# Patient Record
Sex: Male | Born: 1995
Health system: Southern US, Community
[De-identification: ages and names within clinical notes are randomized; demographics above are authoritative.]

## PROBLEM LIST (undated history)

## (undated) DIAGNOSIS — R0602 Shortness of breath: Secondary | ICD-10-CM

## (undated) DIAGNOSIS — R0600 Dyspnea, unspecified: Secondary | ICD-10-CM

## (undated) DIAGNOSIS — R002 Palpitations: Secondary | ICD-10-CM

## (undated) DIAGNOSIS — R5383 Other fatigue: Secondary | ICD-10-CM

## (undated) DIAGNOSIS — R079 Chest pain, unspecified: Secondary | ICD-10-CM

## (undated) DIAGNOSIS — R42 Dizziness and giddiness: Secondary | ICD-10-CM

## (undated) DIAGNOSIS — R000 Tachycardia, unspecified: Secondary | ICD-10-CM

## (undated) DIAGNOSIS — R0609 Other forms of dyspnea: Secondary | ICD-10-CM

## (undated) HISTORY — DX: Tachycardia, unspecified: R00.0

## (undated) HISTORY — DX: Palpitations: R00.2

## (undated) HISTORY — DX: Other forms of dyspnea: R06.09

## (undated) HISTORY — DX: Dyspnea, unspecified: R06.00

## (undated) HISTORY — PX: FRACTURE SURGERY: SHX138

## (undated) HISTORY — DX: Other fatigue: R53.83

## (undated) HISTORY — DX: Dizziness and giddiness: R42

## (undated) HISTORY — DX: Shortness of breath: R06.02

## (undated) HISTORY — DX: Chest pain, unspecified: R07.9

---
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
## 2018-10-23 ENCOUNTER — Other Ambulatory Visit: Payer: Self-pay

## 2018-10-23 ENCOUNTER — Emergency Department (HOSPITAL_COMMUNITY)
Admission: EM | Admit: 2018-10-23 | Discharge: 2018-10-23 | Disposition: A | Discharge status: Home / Self Care | Payer: Self-pay | Attending: Emergency Medicine | Admitting: Emergency Medicine

## 2018-10-23 ENCOUNTER — Encounter (HOSPITAL_COMMUNITY): Payer: Self-pay | Admitting: Emergency Medicine

## 2018-10-23 ENCOUNTER — Emergency Department (HOSPITAL_COMMUNITY): Payer: Self-pay

## 2018-10-23 DIAGNOSIS — R0789 Other chest pain: Secondary | ICD-10-CM | POA: Insufficient documentation

## 2018-10-23 DIAGNOSIS — R0602 Shortness of breath: Secondary | ICD-10-CM

## 2018-10-23 LAB — BASIC METABOLIC PANEL
Anion gap: 9 (ref 5–15)
BUN: 16 mg/dL (ref 6–20)
CALCIUM: 9.6 mg/dL (ref 8.9–10.3)
CO2: 21 mmol/L — ABNORMAL LOW (ref 22–32)
CREATININE: 0.92 mg/dL (ref 0.61–1.24)
Chloride: 106 mmol/L (ref 98–111)
Glucose, Bld: 103 mg/dL — ABNORMAL HIGH (ref 70–99)
Potassium: 3.8 mmol/L (ref 3.5–5.1)
SODIUM: 136 mmol/L (ref 135–145)

## 2018-10-23 LAB — CBC WITH DIFFERENTIAL/PLATELET
Abs Immature Granulocytes: 0.03 10*3/uL (ref 0.00–0.07)
BASOS ABS: 0 10*3/uL (ref 0.0–0.1)
BASOS PCT: 1 %
EOS PCT: 2 %
Eosinophils Absolute: 0.2 10*3/uL (ref 0.0–0.5)
HCT: 49.7 % (ref 39.0–52.0)
HEMOGLOBIN: 15.6 g/dL (ref 13.0–17.0)
Immature Granulocytes: 0 %
LYMPHS PCT: 19 %
Lymphs Abs: 1.5 10*3/uL (ref 0.7–4.0)
MCH: 28.2 pg (ref 26.0–34.0)
MCHC: 31.4 g/dL (ref 30.0–36.0)
MCV: 89.7 fL (ref 80.0–100.0)
Monocytes Absolute: 0.6 10*3/uL (ref 0.1–1.0)
Monocytes Relative: 7 %
NEUTROS ABS: 5.8 10*3/uL (ref 1.7–7.7)
NRBC: 0 % (ref 0.0–0.2)
Neutrophils Relative %: 71 %
PLATELETS: 279 10*3/uL (ref 150–400)
RBC: 5.54 MIL/uL (ref 4.22–5.81)
RDW: 12.4 % (ref 11.5–15.5)
WBC: 8.1 10*3/uL (ref 4.0–10.5)

## 2018-10-23 LAB — TROPONIN I

## 2018-10-23 MED ORDER — ALBUTEROL SULFATE (2.5 MG/3ML) 0.083% IN NEBU
5.0000 mg | INHALATION_SOLUTION | Freq: Once | RESPIRATORY_TRACT | Status: DC
  Filled 2018-10-23: qty 6

## 2018-10-23 MED ORDER — KETOROLAC TROMETHAMINE 30 MG/ML IJ SOLN
30.0000 mg | Freq: Once | INTRAMUSCULAR | Status: AC
  Administered 2018-10-23: 30 mg via INTRAVENOUS
  Filled 2018-10-23: qty 1

## 2018-10-23 MED ORDER — SODIUM CHLORIDE 0.9 % IV BOLUS
1000.0000 mL | Freq: Once | INTRAVENOUS | Status: AC
  Administered 2018-10-23: 1000 mL via INTRAVENOUS

## 2018-10-23 NOTE — ED Notes (Signed)
Pt ambulated with no assistance around nurses station. O2 saturation maintain 91%-96%. Pt denies SOB

## 2018-10-23 NOTE — ED Provider Notes (Signed)
[facility] EMERGENCY DEPARTMENT Provider Note   CSN: [id number] Arrival date & time: 10/23/18  1516     History   Chief Complaint Chief Complaint  Patient presents with  . Shortness of Breath    HPI Bob Khan is a 23 y.o. male.  He is brought in by family for acute onset of tachycardia and shortness of breath with a little bit of chest discomfort that started while he was working out at [employer].  It sounds like he drank fairly heavily last night.  Today he ate some food and then did his pre-routine supplements along with having taken a Claritin-D and ibuprofen.  [place] Spaniel he was working out fairly intensely when he noticed that his heart was beating very fast and he made him feel short of breath.  Currently he feels his heart rate and shortness of breath have improved although he still intermittently having some twinges in his chest.  The history is provided by the patient.  Shortness of Breath  This is a new problem. The current episode started 3 to 5 hours ago. The problem has been gradually improving. Associated symptoms include chest pain. Pertinent negatives include no fever, no rhinorrhea, no sore throat, no neck pain, no cough, no sputum production, no hemoptysis, no wheezing, no syncope, no abdominal pain, no rash, no leg pain and no leg swelling. The problem's precipitants include exercise. He has tried nothing for the symptoms. The treatment provided moderate relief. He has had no prior hospitalizations. He has had no prior ED visits. He has had no prior ICU admissions.    History reviewed. No pertinent past medical history.  There are no active problems to display for this patient.   Past Surgical History:  Procedure Laterality Date  . FRACTURE SURGERY          Home Medications    Prior to Admission medications   Not on File    Family History Family History  Problem Relation Age of Onset  . Heart disease Other     Social History Social History    Tobacco Use  . Smoking status: Never Smoker  . Smokeless tobacco: Never Used  Substance Use Topics  . Alcohol use: Yes  . Drug use: Never     Allergies   Patient has no known allergies.   Review of Systems Review of Systems  Constitutional: Negative for fever.  HENT: Negative for rhinorrhea and sore throat.   Eyes: Negative for visual disturbance.  Respiratory: Positive for shortness of breath. Negative for cough, hemoptysis, sputum production and wheezing.   Cardiovascular: Positive for chest pain. Negative for leg swelling and syncope.  Gastrointestinal: Negative for abdominal pain.  Genitourinary: Negative for dysuria.  Musculoskeletal: Negative for neck pain.  Skin: Negative for rash.     Physical Exam Updated Vital Signs BP (!) 134/98 (BP Location: Right Arm)   Pulse (!) 107   Temp 98 F (36.7 C) (Oral)   Ht 6\' 5"  (1.956 m)   Wt 105.2 kg   SpO2 100%   BMI 27.51 kg/m   Physical Exam Vitals signs and nursing note reviewed.  Constitutional:      Appearance: He is well-developed.  HENT:     Head: Normocephalic and atraumatic.  Eyes:     Conjunctiva/sclera: Conjunctivae normal.  Neck:     Musculoskeletal: Neck supple.  Cardiovascular:     Rate and Rhythm: Normal rate and regular rhythm.     Heart sounds: No murmur.  Pulmonary:  Effort: Pulmonary effort is normal. No respiratory distress.     Breath sounds: Normal breath sounds.  Chest:     Chest wall: Tenderness present.  Abdominal:     Palpations: Abdomen is soft.     Tenderness: There is no abdominal tenderness.  Musculoskeletal: Normal range of motion.     Right lower leg: He exhibits no tenderness. No edema.     Left lower leg: He exhibits no tenderness. No edema.  Skin:    General: Skin is warm and dry.     Capillary Refill: Capillary refill takes less than 2 seconds.  Neurological:     General: No focal deficit present.     Mental Status: He is alert and oriented to person, place, and  time.     Motor: No weakness.      ED Treatments / Results  Labs (all labs ordered are listed, but only abnormal results are displayed) Labs Reviewed  BASIC METABOLIC PANEL - Abnormal; Notable for the following components:      Result Value   CO2 21 (*)    Glucose, Bld 103 (*)    All other components within normal limits  CBC WITH DIFFERENTIAL/PLATELET  TROPONIN I    EKG None  Radiology Dg Chest 2 View  Result Date: 10/23/2018 CLINICAL DATA:  Shortness of breath EXAM: CHEST - 2 VIEW COMPARISON:  None. FINDINGS: The heart size and mediastinal contours are within normal limits. Both lungs are clear. The visualized skeletal structures are unremarkable. IMPRESSION: No active cardiopulmonary disease. Electronically Signed   By: Jasmine Pang M.D.   On: 10/23/2018 15:45    Procedures Procedures (including critical care time)  Medications Ordered in ED Medications  albuterol (PROVENTIL) (2.5 MG/3ML) 0.083% nebulizer solution 5 mg (5 mg Nebulization Not Given 10/23/18 1645)  sodium chloride 0.9 % bolus 1,000 mL (1,000 mLs Intravenous New Bag/Given 10/23/18 1718)  ketorolac (TORADOL) 30 MG/ML injection 30 mg (has no administration in time range)     Initial Impression / Assessment and Plan / ED Course  I have reviewed the triage vital signs and the nursing notes.  Pertinent labs & imaging results that were available during my care of the patient were reviewed by me and considered in my medical decision making (see chart for details).  Clinical Course as of Oct 24 902  Wed Oct 23, 2018  1713 EKG is sinus arrhythmia rate of 95 normal intervals no acute ST-T changes no prior to compare with.   [MB]  1929 Patient received some Toradol and IV fluids with improvement in his symptoms.  His lab work including troponin chest x-ray were unremarkable.  He had a trending pulse ox which she did well.  Patient and family are comfortable with discharge and understand to follow-up with her doctor  and return if any worsening symptoms.   [MB]    Clinical Course User Index [MB] Terrilee Files, MD     Final Clinical Impressions(s) / ED Diagnoses   Final diagnoses:  SOB (shortness of breath)  Acute chest wall pain    ED Discharge Orders    None       Terrilee Files, MD 10/24/18 [phone]

## 2018-10-23 NOTE — ED Triage Notes (Signed)
Patient reports elevated heart rate and SOB that started about 30 minutes ago. Patient states he was at the gym when symptoms started.

## 2018-10-23 NOTE — Progress Notes (Signed)
Dr. Charm Barges discontinued nebulizer treatment. Medication was already opened and placed in nebulizer

## 2018-10-23 NOTE — Discharge Instructions (Addendum)
You were seen in the emergency department for some shortness of breath and pain in your chest.  Your chest x-ray blood work and EKG did not show an obvious cause of your pain.  Your symptoms improved in the department.  Please continue Tylenol and ibuprofen as needed and keep well-hydrated.  Follow-up with your doctor and return if any worsening symptoms.

## 2018-10-23 NOTE — ED Triage Notes (Signed)
Patient reports drinking a large amount of alcohol last night.

## 2018-10-30 ENCOUNTER — Encounter: Payer: Self-pay | Admitting: Family Medicine

## 2018-10-30 ENCOUNTER — Ambulatory Visit: Admitting: Family Medicine

## 2018-10-30 VITALS — BP 130/78 | HR 70 | Ht 77.0 in | Wt 233.0 lb

## 2018-10-30 DIAGNOSIS — R5383 Other fatigue: Secondary | ICD-10-CM

## 2018-10-30 DIAGNOSIS — R000 Tachycardia, unspecified: Secondary | ICD-10-CM | POA: Diagnosis not present

## 2018-10-30 DIAGNOSIS — R0609 Other forms of dyspnea: Secondary | ICD-10-CM | POA: Diagnosis not present

## 2018-10-30 NOTE — Progress Notes (Addendum)
Subjective:    Patient ID: Bob Khan, male    DOB: 15-Apr-1996, 23 y.o.   MRN: [id number]  HPI Patient is here today for a hospital follow up after going to the Ed on New Years day. He states he went to the gym and mid work out he started having mid center chest pain,short of breath,and dizziness.He states the hospital told him the test they ran were all great and could be a pulled muscle. He states he still has some sharp chest pain which radiates to his back.He still has some shortness of breath. Very nice patient Has a complex history As above stated that it started on New Year's Day He ended up having to go to ER His heart rate was running very fast This patient is very athletic he runs 3 to 6 miles almost every single day he also lifts weights He does states he takes a pre-workout powder that does have caffeine in it The night before he did have some alcohol He states he is never had this problem before The heart rate went very fast lasted for at least an hour then it calm down left him short of breath and felt like he was dizzy and then passed out since then he has had a few different spells with a heart rate had sped up very fast lasted for several minutes then went away but he was not on any caffeine or alcohol at that time he denies being stressed out denies panic attacks but he does relate that he got somewhat anxious about these episodes  Review of Systems  Constitutional: Negative for diaphoresis and fatigue.  HENT: Negative for congestion and rhinorrhea.   Respiratory: Negative for cough and shortness of breath.   Cardiovascular: Positive for palpitations. Negative for chest pain and leg swelling.  Gastrointestinal: Negative for abdominal pain and diarrhea.  Skin: Negative for color change and rash.  Neurological: Negative for dizziness and headaches.  Psychiatric/Behavioral: Negative for behavioral problems and confusion.       Objective:   Physical Exam Vitals  signs reviewed.  Constitutional:      General: He is not in acute distress. HENT:     Head: Normocephalic and atraumatic.  Eyes:     General:        Right eye: No discharge.        Left eye: No discharge.  Neck:     Trachea: No tracheal deviation.  Cardiovascular:     Rate and Rhythm: Normal rate and regular rhythm.     Heart sounds: Normal heart sounds. No murmur.  Pulmonary:     Effort: Pulmonary effort is normal. No respiratory distress.     Breath sounds: Normal breath sounds.  Lymphadenopathy:     Cervical: No cervical adenopathy.  Skin:    General: Skin is warm and dry.  Neurological:     Mental Status: He is alert.     Coordination: Coordination normal.  Psychiatric:        Behavior: Behavior normal.    I reviewed over all the labs x-rays EKG ER note       Assessment & Plan:  It is quite possible that the caffeine workout powder as well as alcohol the night before contributed to his problem But he has had this issue several different times since then without having no supplements It is felt more than likely that there is some underlying arrhythmia tendency possibly PSVT  I believe it is reasonable for him  to see cardiology more than likely they will do echo to make sure he is not developing any type of a hypertrophic cardiomyopathy plus also to make sure with the telemetry that he is not developing an arrhythmia issue  This patient is moving to [facility] in several weeks but states he is willing to see local cardiology to get treated and evaluated.  He will avoid all caffeine and alcohol no medications currently lab work ordered await the results 25 minutes spent with patient  Addendum- thyroid testing and d-dimer were ordered to exclude the possibility of hyperthyroidism and pulmonary embolism.  It was felt that the likelihood of pulmonary embolism was low but possible.  D-dimer came back positive on the ninth.  Patient was spoken with.  He was having  some ongoing chest pains and shortness of breath.  He was referred to [facility] -ER doctor in triage was spoken with regarding the elevated d-dimer and concern for the possibility of pulmonary embolus as well as the need for CT angio They stated that they would be aware and worked him up when he presents

## 2018-10-31 ENCOUNTER — Telehealth: Payer: Self-pay | Admitting: Family Medicine

## 2018-10-31 ENCOUNTER — Encounter (HOSPITAL_COMMUNITY): Payer: Self-pay | Admitting: Emergency Medicine

## 2018-10-31 ENCOUNTER — Emergency Department (HOSPITAL_COMMUNITY): Payer: Non-veteran care

## 2018-10-31 ENCOUNTER — Other Ambulatory Visit: Payer: Self-pay

## 2018-10-31 ENCOUNTER — Emergency Department (HOSPITAL_COMMUNITY)
Admission: EM | Admit: 2018-10-31 | Discharge: 2018-10-31 | Disposition: A | Discharge status: Home / Self Care | Payer: Non-veteran care | Attending: Emergency Medicine | Admitting: Emergency Medicine

## 2018-10-31 DIAGNOSIS — R0602 Shortness of breath: Secondary | ICD-10-CM

## 2018-10-31 DIAGNOSIS — R0600 Dyspnea, unspecified: Secondary | ICD-10-CM

## 2018-10-31 DIAGNOSIS — R079 Chest pain, unspecified: Secondary | ICD-10-CM

## 2018-10-31 DIAGNOSIS — R791 Abnormal coagulation profile: Secondary | ICD-10-CM | POA: Insufficient documentation

## 2018-10-31 DIAGNOSIS — R7989 Other specified abnormal findings of blood chemistry: Secondary | ICD-10-CM

## 2018-10-31 LAB — I-STAT CHEM 8, ED
BUN: 17 mg/dL (ref 6–20)
CALCIUM ION: 1.15 mmol/L (ref 1.15–1.40)
CHLORIDE: 105 mmol/L (ref 98–111)
CREATININE: 1 mg/dL (ref 0.61–1.24)
GLUCOSE: 92 mg/dL (ref 70–99)
HCT: 47 % (ref 39.0–52.0)
Hemoglobin: 16 g/dL (ref 13.0–17.0)
Potassium: 4.1 mmol/L (ref 3.5–5.1)
SODIUM: 138 mmol/L (ref 135–145)
TCO2: 25 mmol/L (ref 22–32)

## 2018-10-31 LAB — TSH: TSH: 1.88 u[IU]/mL (ref 0.450–4.500)

## 2018-10-31 LAB — I-STAT TROPONIN, ED: Troponin i, poc: 0 ng/mL (ref 0.00–0.08)

## 2018-10-31 LAB — D-DIMER, QUANTITATIVE (NOT AT ARMC): D-DIMER: 1.54 mg{FEU}/L — AB (ref 0.00–0.49)

## 2018-10-31 LAB — T4, FREE: FREE T4: 1.3 ng/dL (ref 0.82–1.77)

## 2018-10-31 MED ORDER — IOPAMIDOL (ISOVUE-370) INJECTION 76%
100.0000 mL | Freq: Once | INTRAVENOUS | Status: AC | PRN
  Administered 2018-10-31: 100 mL via INTRAVENOUS

## 2018-10-31 NOTE — Telephone Encounter (Signed)
Pt called to check on lab results. 

## 2018-10-31 NOTE — ED Triage Notes (Signed)
Pt sent dr Kennis Carina sent for a ct a

## 2018-10-31 NOTE — Progress Notes (Signed)
Cardiology Office Note   Date:  11/04/2018   ID:  Bob Khan, DOB 1996/10/23, MRN [id number]  PCP:  Babs Sciara, MD  Cardiologist:   Charlton Haws, MD   No chief complaint on file.     History of Present Illness: Bob Khan is a 23 y.o. male who presents for consultation regarding tachycardia and dyspnea. Referred by Dr Gerda Diss. Patient seen in ER 10/23/18 after getting dyspnea, and chest discomfort at gym Had heavy ETOH night before and was taking supplements , cafeinne, Claritin D and ibuprofen Felt somewhat sudden onset of palpitations / tachycardia while working out that led to dyspnea and twinges of pain in his chest  Rx with Toradol and iv fluids with improvement  CXR NAD ECG SR rate 95 sinus arrhythmia with no pre excitation or SVT/PSVT. 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 Chest pain   . Dizziness   . Dyspnea on exertion   . Fatigue   . Palpitations   . SOB (shortness of breath)   . Tachycardia     Past Surgical History:  Procedure Laterality Date  . FRACTURE SURGERY       Current Outpatient Medications  Medication Sig Dispense Refill  . ibuprofen (ADVIL,MOTRIN) 200 MG tablet Take 800 mg by mouth every 6 (six) hours as needed for mild pain.     No current facility-administered medications for this visit.     Allergies:   Patient has no known allergies.    Social History:  The patient  reports that he has never smoked. He has never used smokeless tobacco. He reports current alcohol use. He reports that he does not use drugs.   Family History:  The patient's family history includes Heart disease in an other family member.    ROS:  Please see the  history of present illness.   Otherwise, review of systems are positive for none.   All other systems are reviewed and negative.    PHYSICAL EXAM: VS:  BP (!) 142/82   Pulse 68   Ht 6\' 4"  (1.93 m)   Wt 239 lb (108.4 kg)   BMI 29.09 kg/m  , BMI Body mass index is 29.09 kg/m. Affect appropriate Healthy:  appears stated age HEENT: normal Neck supple with no adenopathy JVP normal no bruits no thyromegaly Lungs clear with no wheezing and good diaphragmatic motion Heart:  S1/S2 no murmur, no rub, gallop or click PMI normal Abdomen: benighn, BS positve, no tenderness, no AAA no bruit.  No HSM or HJR Distal pulses intact with no bruits No edema Neuro non-focal Skin warm and dry No muscular weakness    EKG:  See HPI   Recent Labs: 10/23/2018: Platelets 279 10/30/2018: TSH 1.880 10/31/2018: BUN 17; Creatinine, Ser 1.00; Hemoglobin 16.0; Potassium 4.1; Sodium 138    Lipid Panel No results found for: CHOL, TRIG, HDL, CHOLHDL, VLDL, LDLCALC, LDLDIRECT    Wt Readings from Last 3 Encounters:  11/04/18 239 lb (108.4 kg)  10/31/18 233 lb (105.7 kg)  10/30/18 233 lb 0.6 oz (105.7 kg)      Other studies Reviewed: Additional studies/ records that were  reviewed today include: Notes from ER, labs, CXR ECG notes from primary .    ASSESSMENT AND PLAN:  1.  Tachycardia: likely related to hangover, dehydration, and stimulants. ECG no worrisome signs normal rhythm and no preexcitation see below 2. Dyspnea:  Normal exam , CXR and ECG see below regarding stress echo  3. Chest Pain:  D dimer positive in office CTA negative for PE f/u exercise stress echo    Current medicines are reviewed at length with the patient today.  The patient does not have concerns regarding medicines.  The following changes have been made:  no change  Labs/ tests ordered today include: Ex Stress echo   Orders Placed This Encounter  Procedures  . ECHOCARDIOGRAM STRESS TEST     Disposition:   FU with  cardiology PRN      Signed, Charlton Haws, MD  11/04/2018 11:33 AM    [facility] [address] [place], [place], [place]  [address] Phone: [phone]; Fax: [phone]

## 2018-10-31 NOTE — Discharge Instructions (Signed)
Please follow up with Cardiology Return if worsening (severe pain, worsening shortness of breath, passing out)

## 2018-10-31 NOTE — Telephone Encounter (Signed)
Per Dr Brett Canales: Patient has elevated d dimer and with his symptoms he will need CT angio of the chest to r/o the possibility of blood clot. If patient gets worse in the night with increased SOB or chest pain he needs to return to ER for evaluation and treatment.

## 2018-10-31 NOTE — Telephone Encounter (Signed)
I spoke with the patient over the phone he is having some chest pains and intermittent shortness of breath it is wise for him to get this checked out right away.  I spoke with the ER doctor and triage they will be seeing him and evaluating appropriately tonight

## 2018-10-31 NOTE — Telephone Encounter (Signed)
Patient advised of results of d- dimer. Patient stated he has had a worsening of his symptoms since last seen. He is having an increase in chest pain and doesn't feel like he can get a deep breath. He also feels like his arm is tight and tingling. Patient was advised to go to the ER for evaluation and treatment. Patient agreed to go to the ER.

## 2018-10-31 NOTE — ED Provider Notes (Signed)
[facility] EMERGENCY DEPARTMENT Provider Note   CSN: [id number] Arrival date & time: 10/31/18  1731     History   Chief Complaint Chief Complaint  Patient presents with  . Abnormal Lab    HPI Emeline Ginsicholas J Howk is a 23 y.o. male who presents with chest pain and shortness of breath.  No significant past medical history.  His symptoms started over a week ago and he was initially seen in the emergency department for this problem.  He states that he has had left-sided chest pain which radiates to his left arm and left upper back and shoulder area.  He has had intermittent lightheadedness and numbness and tingling of his left arm.  He also reports intermittent shortness of breath and pain with deep inspiration. It started over a week ago on Jan 1st while lifting weights at the gym. He rates the pain as a 7 out of 10. Things that make it worse is anything that makes his heart rate go up. He reports having a cold a couple weeks ago which has resolved. 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  He takes pre-workout supplements but otherwise denies smoking or any illicit drug use.  After he had a normal work-up in the ED he was sent back to his PCP.  His PCP performed thyroid test and obtained a d-dimer which came back elevated therefore he was sent back to the emergency department to rule out PE.  He has been referred to cardiology and is scheduled for an appointment on Monday.  He was taking ibuprofen for the pain which initially helped but now has not seemed to help at all. No recent surgery/travel/immobilization, hx of cancer, leg swelling, hemoptysis, prior DVT/PE, or hormone use.  HPI  History reviewed. No pertinent past medical history.  There are no active problems to display for this patient.   Past Surgical History:  Procedure Laterality Date  . FRACTURE SURGERY          Home Medications    Prior to Admission medications   Medication Sig Start Date End Date Taking? Authorizing  Provider  ibuprofen (ADVIL,MOTRIN) 200 MG tablet Take 800 mg by mouth every 6 (six) hours as needed for mild pain.    [provider]  loratadine-pseudoephedrine (CLARITIN-D 24 HOUR) 10-240 MG 24 hr tablet Take 1 tablet by mouth daily as needed for allergies.    [provider]    Family History Family History  Problem Relation Age of Onset  . Heart disease Other     Social History Social History   Tobacco Use  . Smoking status: Never Smoker  . Smokeless tobacco: Never Used  Substance Use Topics  . Alcohol use: Yes  . Drug use: Never     Allergies   Patient has no known allergies.   Review of Systems Review of Systems  Constitutional: Positive for diaphoresis. Negative for fever.  Respiratory: Positive for shortness of breath. Negative for cough and wheezing.   Cardiovascular: Positive for chest pain. Negative for palpitations and leg swelling.  Gastrointestinal: Negative for abdominal pain, nausea and vomiting.  Neurological: Positive for light-headedness. Negative for syncope.  All other systems reviewed and are negative.    Physical Exam Updated Vital Signs BP (!) 155/84 (BP Location: Right Arm)   Pulse 76   Temp 98 F (36.7 C) (Oral)   Resp 16   Ht 6\' 5"  (1.956 m)   Wt 105.7 kg   SpO2 100%   BMI 27.63  kg/m   Physical Exam Vitals signs and nursing note reviewed.  Constitutional:      General: He is not in acute distress.    Appearance: Normal appearance. He is well-developed.     Comments: Healthy appearing young male in NAD  HENT:     Head: Normocephalic and atraumatic.  Eyes:     General: No scleral icterus.       Right eye: No discharge.        Left eye: No discharge.     Conjunctiva/sclera: Conjunctivae normal.     Pupils: Pupils are equal, round, and reactive to light.  Neck:     Musculoskeletal: Normal range of motion.  Cardiovascular:     Rate and Rhythm: Normal rate and regular rhythm.     Pulses: Normal pulses.    Pulmonary:     Effort: Pulmonary effort is normal. No respiratory distress.     Breath sounds: Normal breath sounds.  Chest:     Chest wall: Tenderness (left side of chest wall) present.  Abdominal:     General: There is no distension.  Musculoskeletal:        General: Tenderness (left upper thoracic paraspinal muscle tenderness) present.  Skin:    General: Skin is warm and dry.  Neurological:     Mental Status: He is alert and oriented to person, place, and time.  Psychiatric:        Behavior: Behavior normal.      ED Treatments / Results  Labs (all labs ordered are listed, but only abnormal results are displayed) Labs Reviewed  I-STAT TROPONIN, ED  I-STAT CHEM 8, ED    EKG EKG Interpretation  Date/Time:  Thursday October 31 2018 18:18:23 EST Ventricular Rate:  74 PR Interval:    QRS Duration: 89 QT Interval:  371 QTC Calculation: 412 R Axis:   78 Text Interpretation:  Sinus rhythm Borderline ST elevation, anterolateral leads Unchanged from prior. No STEMI.  Confirmed by Alona BeneLong, Joshua [phone]([id number]) on 10/31/2018 6:23:51 PM   Radiology Ct Angio Chest Pe W/cm &/or Wo Cm  Result Date: 10/31/2018 CLINICAL DATA:  Chest pain and shortness of breath. EXAM: CT ANGIOGRAPHY CHEST WITH CONTRAST TECHNIQUE: Multidetector CT imaging of the chest was performed using the standard protocol during bolus administration of intravenous contrast. Multiplanar CT image reconstructions and MIPs were obtained to evaluate the vascular anatomy. CONTRAST:  100mL ISOVUE-370 IOPAMIDOL (ISOVUE-370) INJECTION 76% COMPARISON:  None. FINDINGS: Cardiovascular: The heart size is normal. No substantial pericardial effusion. No thoracic aortic aneurysm. No filling defects in the opacified pulmonary arteries to suggest acute pulmonary embolus. Mediastinum/Nodes: No mediastinal lymphadenopathy. There is no hilar lymphadenopathy. The esophagus has normal imaging features. There is no axillary lymphadenopathy.  Lungs/Pleura: Lungs are clear without focal airspace consolidation or pulmonary edema. No pleural effusion. No pneumothorax. Upper Abdomen: Unremarkable. Musculoskeletal: No worrisome lytic or sclerotic osseous abnormality. Review of the MIP images confirms the above findings. IMPRESSION: Unremarkable study.  No CT evidence for acute pulmonary embolus. Electronically Signed   By: Kennith CenterEric  Mansell M.D.   On: 10/31/2018 19:42    Procedures Procedures (including critical care time)  Medications Ordered in ED Medications  iopamidol (ISOVUE-370) 76 % injection 100 mL (100 mLs Intravenous Contrast Given 10/31/18 1919)     Initial Impression / Assessment and Plan / ED Course  I have reviewed the triage vital signs and the nursing notes.  Pertinent labs & imaging results that were available during my care of the patient were reviewed  by me and considered in my medical decision making (see chart for details).  23 year old male presents with chest pain, SOB and elevated d-dimer. He is hypertensive but otherwise vitals are normal. On exam, heart is regular rate and rhythm. Lungs are CTA. Chest pain is reproducible with palpation. EKG is SR and appears unchanged. I-stat trop and chem-8 are normal. CTA ordered.  CTA of chest is negative for PE. I suspect it is most likely MSK but also consider pericarditis since he recently had a cold. He has a f/u appt with Cardiology on Monday. Had long discussion with the patient, his mother, and girlfriend who are understandably concerned. They were given reassurance and encouraged to f/u. They were given return precautions.  Final Clinical Impressions(s) / ED Diagnoses   Final diagnoses:  Elevated d-dimer  Chest pain, unspecified type  Dyspnea, unspecified type    ED Discharge Orders    None       Bethel Born, PA-C 10/31/18 2049    Maia Plan, MD 10/31/18 2108

## 2018-11-01 ENCOUNTER — Telehealth: Payer: Self-pay | Admitting: Family Medicine

## 2018-11-01 ENCOUNTER — Ambulatory Visit (HOSPITAL_COMMUNITY): Payer: Non-veteran care

## 2018-11-01 NOTE — Telephone Encounter (Signed)
Please let the patient know that I am seeing people 1 after the next all the way through at least 530 I can call him when I finished seeing people This patient does have office visit with cardiologist on Monday he needs to go to that appointment I do not recommend that he does any workouts until he is seen by cardiology  I will be happy to call him later today if he is available

## 2018-11-01 NOTE — Telephone Encounter (Signed)
This patient will be seeing you on Monday He is a former [employer] He does a lot of intense exercise He was seen in the ER on January 1 with palpitations and tachycardia It is possible that this was related to caffeine supplement he took before workout But he has had several spells since then  ER did a work-up as well as our office CT scan negative for pulmonary embolism  Patient being referred to you/cardiology for further evaluation of tachycardia spells and ongoing intermittent chest pain May need echo/telemetry Thank you for your help-Tory Septer

## 2018-11-01 NOTE — Telephone Encounter (Signed)
Pt has some questions regarding his CT from yesterday. He also would like to know when he can return to working out.   CB# [phone].

## 2018-11-01 NOTE — Telephone Encounter (Signed)
The patient had some additional questions regarding his CAT scan He was also concerned about his sugar level Glucose in the ER was normal I reviewed over every aspect of his CAT scan which did not show pulmonary embolism  I also told the patient that this does not explain the fast heart rate he had in the ER and that it still necessary to see the cardiologist more than likely they will do telemetry as well as doing a echo of his heart if he has any ongoing questions he can notify us He voiced his understanding and had no further questions at this time I recommended for the patient to hold off on any exercise until he sees cardiology on Monday

## 2018-11-01 NOTE — Telephone Encounter (Signed)
Discussed with pt. That dr Lorin Picket is booked all the way til at least 5:30. Dr Lorin Picket states he should keep appt with cardiologist Monday and not to exercise until after he sees cardiologist and that dr scott will call him later this afternoon when he is finished seeing pts. Pt states he will be available and can call on same number in message. [phone].

## 2018-11-04 ENCOUNTER — Ambulatory Visit (INDEPENDENT_AMBULATORY_CARE_PROVIDER_SITE_OTHER): Payer: Non-veteran care | Admitting: Cardiovascular Disease

## 2018-11-04 ENCOUNTER — Encounter

## 2018-11-04 VITALS — BP 142/82 | HR 68 | Ht 76.0 in | Wt 239.0 lb

## 2018-11-04 DIAGNOSIS — R000 Tachycardia, unspecified: Secondary | ICD-10-CM

## 2018-11-04 DIAGNOSIS — R0609 Other forms of dyspnea: Secondary | ICD-10-CM | POA: Diagnosis not present

## 2018-11-04 NOTE — Patient Instructions (Signed)
Medication Instructions:   If you need a refill on your cardiac medications before your next appointment, please call your pharmacy.   Lab work:  If you have labs (blood work) drawn today and your tests are completely normal, you will receive your results only by: Marland Kitchen MyChart Message (if you have MyChart) OR . A paper copy in the mail If you have any lab test that is abnormal or we need to change your treatment, we will call you to review the results.  Testing/Procedures: Your physician has requested that you have a stress echocardiogram. For further information please visit [email or web address] Please follow instruction sheet as given.  Follow-Up: At [facility], you and your health needs are our priority.  As part of our continuing mission to provide you with exceptional heart care, we have created designated Provider Care Teams.  These Care Teams include your primary Cardiologist (physician) and Advanced Practice Providers (APPs -  Physician Assistants and Nurse Practitioners) who all work together to provide you with the care you need, when you need it. Your physician recommends that you schedule a follow-up appointment as needed with Dr. Eden Emms.

## 2018-11-07 ENCOUNTER — Telehealth (HOSPITAL_COMMUNITY): Payer: Self-pay | Admitting: *Deleted

## 2018-11-07 NOTE — Telephone Encounter (Signed)
Left message, per DPR, for upcoming stress echo on 11/11/18.  Bob Khan

## 2018-11-08 ENCOUNTER — Other Ambulatory Visit (HOSPITAL_COMMUNITY): Payer: Non-veteran care

## 2018-11-11 ENCOUNTER — Ambulatory Visit (HOSPITAL_COMMUNITY): Payer: Non-veteran care | Attending: Cardiology

## 2018-11-11 ENCOUNTER — Ambulatory Visit (HOSPITAL_BASED_OUTPATIENT_CLINIC_OR_DEPARTMENT_OTHER): Payer: No Typology Code available for payment source

## 2018-11-11 ENCOUNTER — Other Ambulatory Visit: Payer: Self-pay

## 2018-11-11 ENCOUNTER — Ambulatory Visit (INDEPENDENT_AMBULATORY_CARE_PROVIDER_SITE_OTHER): Admitting: Family Medicine

## 2018-11-11 ENCOUNTER — Encounter: Payer: Self-pay | Admitting: Family Medicine

## 2018-11-11 VITALS — BP 128/82 | Temp 97.7°F | Wt 237.0 lb

## 2018-11-11 DIAGNOSIS — R079 Chest pain, unspecified: Secondary | ICD-10-CM | POA: Diagnosis not present

## 2018-11-11 DIAGNOSIS — R000 Tachycardia, unspecified: Secondary | ICD-10-CM | POA: Diagnosis not present

## 2018-11-11 DIAGNOSIS — R0609 Other forms of dyspnea: Secondary | ICD-10-CM | POA: Diagnosis not present

## 2018-11-11 DIAGNOSIS — J019 Acute sinusitis, unspecified: Secondary | ICD-10-CM

## 2018-11-11 MED ORDER — AMOXICILLIN-POT CLAVULANATE 875-125 MG PO TABS: 1.0000 | ORAL_TABLET | Freq: Two times a day (BID) | ORAL | 0 refills | Status: DC

## 2018-11-11 NOTE — Progress Notes (Signed)
   Subjective:    Patient ID: Bob Khan, male    DOB: 09/08/1996, 23 y.o.   MRN: [id number]  Sinusitis  This is a new problem. The current episode started 1 to 4 weeks ago. Associated symptoms include congestion, coughing, ear pain, headaches and swollen glands. (Swollen lymph nodes; throat swelling, trouble breathing at night, dizziness with pressure behind ears) Treatments tried: Mucinex, Sudafed. The treatment provided no relief.   Patient's had some intermittent wheezing/dizzy spells been on a fairly persistent basis over the course of the past few weeks he is also had persistent head congestion drainage and feeling pressure behind his ears he feels like his ears are in a barrel     Review of Systems  HENT: Positive for congestion and ear pain.   Respiratory: Positive for cough.   Neurological: Positive for headaches.       Objective:   Physical Exam Eardrums are normal bilaterally throat is normal neck no adenopathy lungs clear respiratory rate is normal heart regular no murmurs  15 minutes was spent with patient today discussing healthcare issues which they came.  More than 50% of this visit-total duration of visit-was spent in counseling and coordination of care.  Please see diagnosis regarding the focus of this coordination and care   Romberg negative finger-to-nose normal no nystagmus noted    Assessment & Plan:  I believe what is most likely going Is secondary to sinus We will go with Augmentin 875 mg twice daily for 10 days Flonase as needed for the next few weeks  Certainly a possibility of eustachian tube dysfunction  Intermittent lightheadedness-probably related to sinuses neurologically everything checks out well today I do not recommend any type of MRI If patient has persistent issues notify us No need for ENT referral at this point  Patient having intermittent feeling short of breath it is possible postnasal drip can cause bronchial irritation but I  do believe it is a good idea for him to follow through with doing the stress echo He would benefit from having a follow-up visit with cardiology

## 2018-11-22 ENCOUNTER — Encounter: Payer: Self-pay | Admitting: Family Medicine

## 2018-11-22 ENCOUNTER — Ambulatory Visit (INDEPENDENT_AMBULATORY_CARE_PROVIDER_SITE_OTHER): Payer: Self-pay | Admitting: Family Medicine

## 2018-11-22 VITALS — BP 126/82 | Temp 97.9°F | Ht 76.0 in | Wt 240.4 lb

## 2018-11-22 DIAGNOSIS — J019 Acute sinusitis, unspecified: Secondary | ICD-10-CM

## 2018-11-22 DIAGNOSIS — R0602 Shortness of breath: Secondary | ICD-10-CM

## 2018-11-22 MED ORDER — CEFDINIR 300 MG PO CAPS: 300.0000 mg | ORAL_CAPSULE | Freq: Two times a day (BID) | ORAL | 0 refills | Status: DC

## 2018-11-22 NOTE — Progress Notes (Signed)
   Subjective:    Patient ID: Bob Khan, male    DOB: 07-02-1996, 23 y.o.   MRN: [id number]  Sinus Problem  This is a new problem. The current episode started 1 to 4 weeks ago. Associated symptoms include congestion, coughing and headaches. Treatments tried: augmentin, claritin, mucinex, advil.   Pt notes dizziness, transient, feels dizzy, and cannot catch  Breath   Feels very weak fatigued at times  Late at night not as bad  No hx of inhaler   Does not smoke no vaping of extract    Ongoing frontal headache some nasal congestion.  Has finished his antibiotics  Review of Systems  HENT: Positive for congestion.   Respiratory: Positive for cough.   Neurological: Positive for headaches.       Objective:   Physical Exam  Alert, mild malaise. Hydration good Vitals stable. frontal/ maxillary tenderness evident positive nasal congestion. pharynx normal neck supple  lungs clear/no crackles or wheezes. heart regular in rhythm       Assessment & Plan:  Impression rhinosinusitis likely post viral, discussed with patient. plan antibiotics prescribed. Questions answered. Symptomatic care discussed. warning signs discussed. WSL   Patient has substantial symptomatology.  Notes shortness of breath.  Worried about it a bit.  Not completely negative cardiac work-up.  Exercises regularly without disease.  Also intermittent dizziness nonspecific.  We will press on and take therapy for persistent sinusitis.  If this does not improve the next step would be a pulmonary work-up including chest x-ray and pulmonary function test.  I really see no indication for a pulmonologist at this time other than the patient's perceived symptomatology.  We did have a frank discussion and anxiety may well impact on this

## 2018-12-10 ENCOUNTER — Telehealth: Payer: Self-pay | Admitting: Family Medicine

## 2018-12-10 NOTE — Telephone Encounter (Signed)
Patient states has been on two rounds of antibiotic and still has a sinus infection. He wanting to know if he needed to be seen again or would you call in something different to [facility]

## 2018-12-10 NOTE — Telephone Encounter (Signed)
Patient states he is not running a temp,but he was seen 11/11/2018 and was given Amoxicillin 875 one bid,then seen 11/22/2018 and started on Cefdinir 300 mg bid. Patient states not getting better. He was advised he needed to be seen again and was transferred to the front to set up an appointment for tomorrow.

## 2018-12-11 ENCOUNTER — Encounter: Payer: Self-pay | Admitting: Family Medicine

## 2018-12-11 ENCOUNTER — Ambulatory Visit (INDEPENDENT_AMBULATORY_CARE_PROVIDER_SITE_OTHER): Payer: Self-pay | Admitting: Family Medicine

## 2018-12-11 VITALS — BP 122/84 | Temp 98.2°F | Wt 248.0 lb

## 2018-12-11 DIAGNOSIS — J329 Chronic sinusitis, unspecified: Secondary | ICD-10-CM

## 2018-12-11 MED ORDER — LEVOFLOXACIN 500 MG PO TABS: 500.0000 mg | ORAL_TABLET | Freq: Every day | ORAL | 0 refills | Status: DC

## 2018-12-11 NOTE — Patient Instructions (Addendum)
Start taking an over the counter steroid nasal spray like generic flonase, 2 sprays each nostril daily.  Should also start taking an over the counter generic allergy medication daily like fexofenadine (Allegra).   If symptoms aren't improving in the next 2 weeks please call us and we will submit referral for ENT specialist.

## 2018-12-11 NOTE — Progress Notes (Signed)
Subjective:    Patient ID: Bob Khan, male    DOB: 1996-03-12, 23 y.o.   MRN: [id number]  Sinus Problem  This is a new problem. The current episode started more than 1 month ago. Associated symptoms include congestion, coughing, shortness of breath and sinus pressure. Pertinent negatives include no chills. Treatments tried: augmentin and omnicef.   Reports completing all abx, No improvement in symptoms, no fevers. Reports congestion, face throbbing, ears feel like they need to pop feel full. Feels dizzy almost all the time, states only time he doesn't feel dizzy is when he's lying down or sleeping. Sometimes feels like he can't catch his breath - still able to do all his normal activities and workouts. Cough is non-productive.   Has tried sudafed and mucinex. States benadryl seems to help, only takes at night so he can sleep.   Moving to [place] [place] for a new job this weekend and trying to get this under control. Had been living in [place] for 3 years - flew home in December prior to this starting.   Was vaping for a little while prior to this illness, but has since quit.   Review of Systems  Constitutional: Negative for chills and fever.  HENT: Positive for congestion, sinus pressure and sinus pain. Negative for ear discharge.   Eyes: Negative for discharge.  Respiratory: Positive for cough and shortness of breath. Negative for wheezing.   Cardiovascular: Negative for chest pain.  Neurological: Positive for dizziness. Negative for syncope.       Objective:   Physical Exam Vitals signs and nursing note reviewed.  Constitutional:      General: He is not in acute distress.    Appearance: Normal appearance. He is not toxic-appearing.  HENT:     Head: Normocephalic and atraumatic.     Right Ear: Tympanic membrane normal.     Left Ear: Tympanic membrane normal.     Nose: Congestion present.     Right Sinus: Maxillary sinus tenderness and frontal sinus tenderness present.   Left Sinus: Maxillary sinus tenderness and frontal sinus tenderness present.     Mouth/Throat:     Mouth: Mucous membranes are moist.     Pharynx: Oropharynx is clear.  Eyes:     General:        Right eye: No discharge.        Left eye: No discharge.     Extraocular Movements: Extraocular movements intact.     Right eye: No nystagmus.     Left eye: No nystagmus.  Neck:     Musculoskeletal: Neck supple. No neck rigidity.  Cardiovascular:     Rate and Rhythm: Normal rate and regular rhythm.     Heart sounds: Normal heart sounds.  Pulmonary:     Effort: Pulmonary effort is normal. No respiratory distress.     Breath sounds: Normal breath sounds. No wheezing or rales.  Lymphadenopathy:     Cervical: No cervical adenopathy.  Skin:    General: Skin is warm and dry.  Neurological:     Mental Status: He is alert and oriented to person, place, and time.     Coordination: Romberg sign negative. Finger-Nose-Finger Test normal.     Gait: Gait normal.  Psychiatric:        Behavior: Behavior normal.           Assessment & Plan:  Rhinosinusitis Lengthy discussion with patient regarding his continued sinus symptoms. Reviewed over his previous OVs and cardiology workup with  Dr. Lorin Picket. Cardiology workup is reassuring. Strongly feel his dizziness and shortness of breath is likely r/t his sinus symptoms. Pt is in no distress today, O2 sats normal, able to talk in complete sentences, able to complete his normal workouts without difficulty per patient. Recommend he start taking an OTC steroid nasal spray daily such as generic flonase. Also should add on a generic allergy tablet daily such as fexofenadine. Will treat continued sinus symptoms with levaquin x 7 days. If his symptoms aren't improving in the next 2 weeks he is to call and let us know and we will submit ENT referral down in [facility] for him. Discussed possibility of going ahead with pulmonology referral for PFT based on his c/o shortness  of breath, pt states he will wait and see how he responds to this treatment over the next couple weeks, if still having shortness of breath then will refer to pulmonology at that time.   Dr. Lilyan Punt was consulted on this case and is in agreement with the above treatment plan.

## 2018-12-17 ENCOUNTER — Telehealth: Payer: Self-pay | Admitting: Family Medicine

## 2018-12-17 NOTE — Telephone Encounter (Signed)
1.  We can help with sending in a different antibiotic 2.  If he is still having ongoing trouble with sinuses the next step would be is for him to get in with ENT in [place] #3 we can refer him down there but it can often take a couple weeks to get in #4 it would be helpful with the patient can give [place] symptomatology on what is going on

## 2018-12-17 NOTE — Telephone Encounter (Signed)
Please advise 

## 2018-12-17 NOTE — Telephone Encounter (Signed)
Patient has been seen twice in office we sinus infection 11/22/18 and 12/11/18. He was put on Levaquin 500 mg at his last visit. Patient has moved to [place] now and wont be able to come to the office for a recheck. He works 8 to 5 everyday Mom Gunnar Fusi) states and has no insurance right now. She was wondering what else can be done. She is on his [phone]

## 2018-12-17 NOTE — Telephone Encounter (Signed)
I called left a message to r/c. 

## 2018-12-26 MED ORDER — DOXYCYCLINE HYCLATE 100 MG PO TABS: 100.0000 mg | ORAL_TABLET | Freq: Two times a day (BID) | ORAL | 0 refills | Status: DC

## 2018-12-26 NOTE — Telephone Encounter (Signed)
Patient would like antibiotic sent sent into pharmacy -[facility] in [place]. Patient states he has appt with ENT in [place] on Monday

## 2018-12-26 NOTE — Telephone Encounter (Signed)
Prescription sent electronically to pharmacy. Patient notified. 

## 2018-12-26 NOTE — Addendum Note (Signed)
Addended by: Margaretha Sheffield on: 12/26/2018 03:06 PM   Modules accepted: Orders

## 2018-12-26 NOTE — Telephone Encounter (Signed)
Recommend doxycycline 110 days take with a snack plus liquids

## 2019-01-16 ENCOUNTER — Ambulatory Visit (INDEPENDENT_AMBULATORY_CARE_PROVIDER_SITE_OTHER): Payer: No Typology Code available for payment source | Admitting: Family Medicine

## 2019-01-16 ENCOUNTER — Other Ambulatory Visit: Payer: Self-pay

## 2019-01-16 ENCOUNTER — Telehealth: Payer: Self-pay

## 2019-01-16 VITALS — Ht 77.0 in

## 2019-01-16 DIAGNOSIS — J3089 Other allergic rhinitis: Secondary | ICD-10-CM | POA: Diagnosis not present

## 2019-01-16 MED ORDER — PANTOPRAZOLE SODIUM 40 MG PO TBEC: 40.0000 mg | DELAYED_RELEASE_TABLET | Freq: Every day | ORAL | 3 refills | Status: DC

## 2019-01-16 MED ORDER — AZELASTINE HCL 0.1 % NA SOLN: 2.0000 | Freq: Two times a day (BID) | NASAL | 12 refills | Status: DC

## 2019-01-16 NOTE — Telephone Encounter (Signed)
Contacted mom and mom states that patient has been on Allegra and Flonase. Mom states that patient needs something stronger. Mom would like provider to contact patient via phone. Mom states that if we can give her a time, she will call and let patient to know that provider is going to call. Please advise. Thank you

## 2019-01-16 NOTE — Telephone Encounter (Signed)
1.  I tried calling the patient's phone number and got the voicemail 2.  Based upon the symptoms I would recommend adding allergy medicine he can use over-the-counter generic version of Allegra-this is fexofenadine 180 mg 1 daily He can also add Flonase 2 sprays each nostril every single day He may use a nasal decongestant nasal spray but for no more than 2 to 3 days because using it beyond 3 days runs the risk of the nose getting "hooked"  Unfortunately it is pollen season so this can certainly cause a lot of nasal congestion and sinus pressure I highly recommend that he follow-up with the allergist as planned

## 2019-01-16 NOTE — Telephone Encounter (Signed)
Patient mother is calling today concerned about her son. She states he has had some jaw pain and sinus pressure since December.Had a history of chest pressure was seen by Cardiologist and was cleared,thye checked per mother for pulmonary embolism,this was cleared, he was also seen by ent and they took xrays and sinus clear.He has taken Steroid spray and pills.  He is calling her today stating he thinks he may have to go to the ed because he cant breath through his nose. He is not having any problems with getting breaths through his chest.   She states he feels like head is stopped up and ear are too.Constant pressure in the head, nose,and ears.  He is not running a fever, no cough.  He is taking Allegra. See's Allergist on April 22,2020.  He is currently in [place] and they have several cases of corona virus there and she does not want him to go to the Ed to be exposed. She states he may come home tomorrow.   She would like for him to have a tele visit today.  Please advise.

## 2019-01-16 NOTE — Telephone Encounter (Signed)
1.  There may not be anything stronger #2 please have the patient call and within the next 45 minutes and then connected with me otherwise I can call in a different nasal spray

## 2019-01-16 NOTE — Progress Notes (Signed)
   Subjective:    Patient ID: Bob Khan, male    DOB: 12-06-1995, 23 y.o.   MRN: [id number]  HPI Telephone visit was done today per patient request Videography was not capable Verification that I was speaking with the patient was completed Patient is calling today with concerns He states he has had some jaw pain and sinus pressure since December.Had a history of chest pressure was seen by Cardiologist and was cleared,thye checked per mother for pulmonary embolism,this was cleared, he was also seen by ent and they took xrays and sinus clear.He has taken Steroid spray and pills.  He is calling his mother today stating he thinks he may have to go to the ed because he cant breath through his nose. He is not having any problems with getting breaths through his chest he feels like head is stopped up and ear are too.Constant pressure in the head, nose,and ears. Patient denies being anxious or nervous He is not running a fever, no cough.  He is taking Allegra. See's Allergist on April 22,2020.  He is currently in [place] and they have several cases of corona virus there and she does not want him to go to the Ed to be exposed. She states he may come home tomorrow.  The patient relates a lot of nasal pressure and congestion difficult time breathing through his nose denies high fever chills sweats wheezing difficulty breathing.  Denies vomiting diarrhea. Review of Systems    15 minutes spent on the phone with patient Objective:   Physical Exam        Assessment & Plan:  Persistent nasal congestion and stuffiness along with pressure in the nasal passages Also feels a rawness on the top of his throat sometimes like it is closing The best I can tell from talking with the patient he is not having respiratory difficulty is more nasal congestion He is already seen ENT that told him with his sinuses looked good He is currently taking Allegra and Flonase We will add Astelin spray He is  seeing an allergist at the end of April which would be beneficial The patient did inquire if MRI of his throat would be a wise thing to do I do not feel that this would be something he would need to do at this time.  If he has ongoing trouble the next step would be is referral to [facility] I also encouraged him to become established with internal medicine or family practice doctor in [place] since he is now living there full-time

## 2019-01-16 NOTE — Telephone Encounter (Signed)
I spoke with the patient mother and she will have the patient call in the next few minutes. Mother wanted to know if you would consider starting him on an immunotherapy pill. Please advise.

## 2019-01-16 NOTE — Telephone Encounter (Signed)
I did speak with this patient please see documentation

## 2019-01-16 NOTE — Telephone Encounter (Signed)
Patient called in for phone visit to Dr.Scott today at 3:20 pm.

## 2019-01-16 NOTE — Telephone Encounter (Signed)
Patient phone # [phone] Mother Shelly [phone].

## 2019-01-20 NOTE — Addendum Note (Signed)
Addended by: Efton Thomley A on: 01/20/2019 02:05 PM   Modules accepted: Level of Service  

## 2019-01-28 DIAGNOSIS — J3089 Other allergic rhinitis: Secondary | ICD-10-CM | POA: Diagnosis not present

## 2019-01-28 DIAGNOSIS — J301 Allergic rhinitis due to pollen: Secondary | ICD-10-CM | POA: Diagnosis not present

## 2019-01-28 DIAGNOSIS — R0981 Nasal congestion: Secondary | ICD-10-CM | POA: Diagnosis not present

## 2019-02-24 DIAGNOSIS — J342 Deviated nasal septum: Secondary | ICD-10-CM | POA: Diagnosis not present

## 2019-02-24 DIAGNOSIS — R0981 Nasal congestion: Secondary | ICD-10-CM | POA: Diagnosis not present

## 2019-02-24 DIAGNOSIS — J3489 Other specified disorders of nose and nasal sinuses: Secondary | ICD-10-CM | POA: Diagnosis not present

## 2019-03-06 ENCOUNTER — Telehealth: Payer: Self-pay | Admitting: Family Medicine

## 2019-03-06 NOTE — Telephone Encounter (Signed)
Mom says he has had several visits dealing with his sinus issues and nothing has really helped.  She is concerned and wanted to speak with the nurse.  Mom is on hippa and ok to talk to.

## 2019-03-06 NOTE — Telephone Encounter (Signed)
Patient seen 11/11/18, 12/11/18 and 01/16/19 with sinus issues- was sent to allergist has had ct scan and MRI and allergy testing and all came back ok but he is completley focused on something being wrong and stays worked up and she feels like it is affecting his quality of life. Mom thinks maybe he needs an antidepressant or something to help him with this. Mom is on DPR and would love to talk to talk to Bob Khan. Bob Khan does not know she called but she is very concerned about him.  Mother is aware Bob Khan will see this message on Monday.

## 2019-03-07 NOTE — Telephone Encounter (Signed)
Mother is aware that Dr Lorin Picket will address the the Message on Monday

## 2019-03-07 NOTE — Telephone Encounter (Signed)
So inform the mom that I can have a brief conversation but the conversation will not take the place of a office visit.  I have encouraged him to establish himself with a physician in that area so that he can do regular office visits to help him through this issue. If he is having anxiety or depression related issues he will need to see a mental health specialist.  I will connect with the mother later on Monday

## 2019-03-10 NOTE — Telephone Encounter (Signed)
As requested I was going to contact the mother The name left on this message is different than the mother's name left on the FYI  The name given on the phone message is not the same name that is under the FYI tag This makes me concerned given that this is a HIPPA situation Please verify with patient who is on his allowable agreement to talk with the doctor

## 2019-03-11 ENCOUNTER — Telehealth: Payer: Self-pay | Admitting: Family Medicine

## 2019-03-11 DIAGNOSIS — R0981 Nasal congestion: Secondary | ICD-10-CM | POA: Diagnosis not present

## 2019-03-11 DIAGNOSIS — J301 Allergic rhinitis due to pollen: Secondary | ICD-10-CM | POA: Diagnosis not present

## 2019-03-11 NOTE — Telephone Encounter (Signed)
I called the patient to get more information. Mailbox full.

## 2019-03-11 NOTE — Telephone Encounter (Signed)
I advised the mother that we could not speak with her as she is not on Dpr. Mother wanted me to let you know he is no better. She states the patient has had an appointment with you regarding feeling as if something is stuck in his nose and unable to breath that well. He has seen allergist,ent, had full head mri. Per mother patient is worried sick that something is wrong with him. Per mother she wants to know if he could be started on an antidepressant.Pt has not stated he wanted to be started on one,but mother thinks if you suggest this he would try it. She states she is worried about him.She wants to know if you could speak with her. She states he works and would be able to do a e visit,but it would need to after 5 pm around 5:30p either today or [phone](mother) Shelly.

## 2019-03-11 NOTE — Telephone Encounter (Signed)
Patient mom Bob Khan) calling to give you updates on her son, she states you spoke with her last week . She wanted to leave some updated medical issues with you not discuss any thing that going on with him. But their is no DPR on file to speak with her. He has one on file but her name is not added. Please Advise. Her number is [phone]

## 2019-03-12 NOTE — Telephone Encounter (Signed)
Tried to contact patient. Pt voicemail is full and can not accept messages

## 2019-03-12 NOTE — Telephone Encounter (Signed)
Given the confusion of the situation-with the mother calling and concerned but also not on the DPR and with [place] strict regulations regarding HIPPA I would recommend the young man to a virtual visit with [place].  The latest I could offer would be 4 PM on Thursday due to our cut hours Certainly we are willing to help this young man and share concern but the patient will have to be able to do the visit within the confines of our hours Thanks

## 2019-03-12 NOTE — Telephone Encounter (Signed)
On another message I did state that I be willing to do a virtual visit with Bob Khan.  We could do a late afternoon 1.  Please see other my chart message

## 2019-03-13 ENCOUNTER — Other Ambulatory Visit: Payer: Self-pay

## 2019-03-13 ENCOUNTER — Ambulatory Visit (INDEPENDENT_AMBULATORY_CARE_PROVIDER_SITE_OTHER): Payer: BLUE CROSS/BLUE SHIELD | Admitting: Family Medicine

## 2019-03-13 ENCOUNTER — Other Ambulatory Visit: Payer: Self-pay | Admitting: *Deleted

## 2019-03-13 DIAGNOSIS — F329 Major depressive disorder, single episode, unspecified: Secondary | ICD-10-CM | POA: Diagnosis not present

## 2019-03-13 DIAGNOSIS — R0981 Nasal congestion: Secondary | ICD-10-CM | POA: Diagnosis not present

## 2019-03-13 DIAGNOSIS — F419 Anxiety disorder, unspecified: Secondary | ICD-10-CM | POA: Diagnosis not present

## 2019-03-13 MED ORDER — SERTRALINE HCL 50 MG PO TABS: 50.0000 mg | ORAL_TABLET | Freq: Every day | ORAL | 2 refills | Status: DC

## 2019-03-13 NOTE — Progress Notes (Signed)
Subjective:    Patient ID: Bob Khan, male    DOB: 1996/07/31, 23 y.o.   MRN: [id number] A/V HPI   Patient has had a difficult time over the past 6 to 8 weeks.  He feels that there is a built-up pressure in his nasal passages.  He also relates that it is very difficult to breathe through his nose.  He also relates every morning when he wakes up he feels this pressure.  He is concerned that there may be something wrong.  He has seen ENT they did a CT scan of the sinuses he is also seen a allergist.  He is tried over-the-counter measures including Flonase and allergy tablets without success.  The patient states that the frequency of this issue is now weighing on him he finds himself worried frequently.  He also finds himself feeling very stressed and feeling depressed but he denies being suicidal.    Since PHQ 9 rates out at 18   feeling as if something is stuck in his nose and unable to breath that well. He has seen allergist,ent, had full head mri. Starting to feel depressed since having this sinus issue. phq9 done.   Virtual Visit via Video Note  I connected with Bob Khan on 03/13/19 at 12:00 PM EDT by a video enabled telemedicine application and verified that I am speaking with the correct person using two identifiers.  Location: Patient: home Provider: office   I discussed the limitations of evaluation and management by telemedicine and the availability of in person appointments. The patient expressed understanding and agreed to proceed.  History of Present Illness:    Observations/Objective:   Assessment and Plan:   Follow Up Instructions:    I discussed the assessment and treatment plan with the patient. The patient was provided an opportunity to ask questions and all were answered. The patient agreed with the plan and demonstrated an understanding of the instructions.   The patient was advised to call back or seek an in-person evaluation if the  symptoms worsen or if the condition fails to improve as anticipated.  I provided 18 minutes of non-face-to-face time during this encounter.      Review of Systems  Constitutional: Negative for activity change, chills and fever.  HENT: Positive for congestion, sinus pressure and sore throat. Negative for ear pain and rhinorrhea.   Eyes: Negative for discharge.  Respiratory: Negative for cough, shortness of breath and wheezing.   Cardiovascular: Negative for chest pain and leg swelling.  Gastrointestinal: Negative for nausea and vomiting.  Musculoskeletal: Negative for arthralgias.  Psychiatric/Behavioral: Negative for suicidal ideas. The patient is nervous/anxious.        Objective:   Physical Exam Patient had virtual visit Appears to be in no distress Atraumatic Neuro able to relate and oriented No apparent resp distress Color normal   Dr Franchot Mimes ENT     Assessment & Plan:  Persistent nasal congestion and difficulty breathing through his nose This patient would benefit from going back to the ENT and having a fiberoptic evaluation of the nasal passages and his larynx since he does have some throat persistent irritation  Potentially additional i nasal nhalers could be tried including Dymista  I did discuss with the patient the possibility of a second opinion with ENT should his current evaluation yielded no results  I do not feel seeing another allergist would be of any benefit  Also believe this patient is having significant worry and depression symptoms related  to this episode.  We talked about coping with this we also talked about medication choices he is interested in trying medication. Sertraline will be tried but he was also counseled that sometimes antidepressants can cause increased depression and potential suicidal ideation and if he starts noticing this immediately stop the medicine and notify us immediately.  Patient was also encouraged to seek emergency care  should he start feeling suicidal or severely depressed  We will touch base with the patient again next week but we will also follow-up in approximately 2 to 3 weeks to see how he is doing.

## 2019-03-13 NOTE — Telephone Encounter (Signed)
Called pt and scheduled appt for today at 12 with dr Lorin Picket

## 2019-03-14 ENCOUNTER — Encounter: Payer: Self-pay | Admitting: Family Medicine

## 2019-03-14 DIAGNOSIS — J329 Chronic sinusitis, unspecified: Secondary | ICD-10-CM | POA: Diagnosis not present

## 2019-03-14 DIAGNOSIS — J312 Chronic pharyngitis: Secondary | ICD-10-CM | POA: Diagnosis not present

## 2019-03-14 DIAGNOSIS — J301 Allergic rhinitis due to pollen: Secondary | ICD-10-CM | POA: Diagnosis not present

## 2019-03-27 ENCOUNTER — Encounter: Payer: Self-pay | Admitting: Family Medicine

## 2019-03-27 ENCOUNTER — Other Ambulatory Visit: Payer: Self-pay | Admitting: Family Medicine

## 2019-03-27 MED ORDER — SERTRALINE HCL 50 MG PO TABS: 50.0000 mg | ORAL_TABLET | Freq: Every day | ORAL | 2 refills | Status: DC

## 2019-04-20 DIAGNOSIS — Z20828 Contact with and (suspected) exposure to other viral communicable diseases: Secondary | ICD-10-CM | POA: Diagnosis not present

## 2019-05-01 ENCOUNTER — Ambulatory Visit: Payer: Self-pay | Admitting: Family Medicine

## 2019-05-12 ENCOUNTER — Encounter: Payer: Self-pay | Admitting: Family Medicine

## 2019-05-12 ENCOUNTER — Other Ambulatory Visit: Payer: Self-pay

## 2019-05-12 ENCOUNTER — Ambulatory Visit (INDEPENDENT_AMBULATORY_CARE_PROVIDER_SITE_OTHER): Payer: BC Managed Care – PPO | Admitting: Family Medicine

## 2019-05-12 DIAGNOSIS — R42 Dizziness and giddiness: Secondary | ICD-10-CM | POA: Diagnosis not present

## 2019-05-12 DIAGNOSIS — R0981 Nasal congestion: Secondary | ICD-10-CM

## 2019-05-12 DIAGNOSIS — R51 Headache: Secondary | ICD-10-CM | POA: Diagnosis not present

## 2019-05-12 DIAGNOSIS — R519 Headache, unspecified: Secondary | ICD-10-CM

## 2019-05-12 MED ORDER — PREDNISONE 20 MG PO TABS: ORAL_TABLET | ORAL | 0 refills | Status: DC

## 2019-05-12 NOTE — Progress Notes (Signed)
Subjective:    Patient ID: Bob Khan, male    DOB: 04/06/1996, 23 y.o.   MRN: [id number] Video visit HPI This is a very nice young gentleman.  He is having a difficult time with head congestion and nasal symptoms.  He has seen ENT who is done significant evaluation without finding issues.  The patient states he is having ongoing nasal pressure stuffiness as well as the posterior palate at times feels pressure above it he also occasionally gets some sharp chest pains his chest discomforts was worked up by cardiology and had a negative stress echo earlier this year  This patient is having significant sinus type symptoms but he is also relating frequent headaches dizziness and pressure feeling in the top of his head he denies double vision nausea or vomiting  No current depression but the patient is very frustrated by his ongoing health issues and does not feel like anybody is able to get to the bottom of the issues Patient calls for a follow up on depression and anxiety. Patient states he didn't see any difference on the med so he weaned himself off of it and stopped it completely.  Virtual Visit via Video Note  I connected with Bob Khan on 05/12/19 at  4:10 PM EDT by a video enabled telemedicine application and verified that I am speaking with the correct person using two identifiers.  Location: Patient: home Provider: office   I discussed the limitations of evaluation and management by telemedicine and the availability of in person appointments. The patient expressed understanding and agreed to proceed.  History of Present Illness:    Observations/Objective:   Assessment and Plan:   Follow Up Instructions:    I discussed the assessment and treatment plan with the patient. The patient was provided an opportunity to ask questions and all were answered. The patient agreed with the plan and demonstrated an understanding of the instructions.   The patient was  advised to call back or seek an in-person evaluation if the symptoms worsen or if the condition fails to improve as anticipated.  I provided 16 minutes of non-face-to-face time during this encounter.      Review of Systems  Constitutional: Negative for activity change, chills and fever.  HENT: Positive for congestion. Negative for ear pain and rhinorrhea.   Eyes: Negative for discharge.  Respiratory: Negative for cough and wheezing.   Cardiovascular: Negative for chest pain.  Gastrointestinal: Negative for nausea and vomiting.  Musculoskeletal: Negative for arthralgias.       Objective:   Physical Exam Patient had virtual visit Appears to be in no distress Atraumatic Neuro able to relate and oriented No apparent resp distress Color normal        Assessment & Plan:  I am very sympathetic for this patient's difficult time he is having I did tell him that we could help set up of [facility] addition to this the patient requested him short course of prednisone to help his symptoms of allergies these were given along with the instruction to continue the Astelin as well as OTC either fexofenadine or loratadine  Intermittent dizziness with headaches given his ongoing symptoms I think it is reasonable to get a neurology consultation possibly they would do an MRI of the brain and further evaluation as well  As for stress and depression the patient really feels that if his physical symptoms could see improvement he would feel a whole lot  better about things he does not feel that the medication benefited him and he stopped taking his Zoloft-once again he feels his main issues is just stress related to his health issues and does not want to be on additional medicines currently

## 2019-05-13 NOTE — Addendum Note (Signed)
Addended by: Vicente Males on: 05/13/2019 08:36 AM   Modules accepted: Orders

## 2019-05-13 NOTE — Progress Notes (Signed)
Referrals have been placed. Per Dr.Scott, please send my chart messages regarding referrals. Thank you

## 2019-05-15 ENCOUNTER — Telehealth: Payer: Self-pay | Admitting: Family Medicine

## 2019-05-15 ENCOUNTER — Other Ambulatory Visit: Payer: Self-pay | Admitting: Family Medicine

## 2019-05-15 NOTE — Telephone Encounter (Signed)
Nurses please see my chart message Per patient request we are sending in Cymbalta 30 mg 1 daily, #30, no refills Encourage patient to read his MyChart message and send [place] an update in 3 to 4 weeks Also please confirm which pharmacy in [place] he once the prescription sent to then please send in the prescription please note on the prescription to stop Zoloft.  Also may remove Zoloft from his list.  Thank you

## 2019-05-16 ENCOUNTER — Encounter: Payer: Self-pay | Admitting: Family Medicine

## 2019-05-16 NOTE — Telephone Encounter (Signed)
Tried to call no answer

## 2019-05-19 NOTE — Telephone Encounter (Signed)
Tried to call no answer. Voicemail full.  

## 2019-05-21 ENCOUNTER — Other Ambulatory Visit: Payer: Self-pay | Admitting: Family Medicine

## 2019-05-21 MED ORDER — DULOXETINE HCL 30 MG PO CPEP: ORAL_CAPSULE | ORAL | 2 refills | Status: AC

## 2019-05-21 NOTE — Addendum Note (Signed)
Addended by: Vicente Males on: 05/21/2019 02:35 PM   Modules accepted: Orders

## 2019-05-21 NOTE — Telephone Encounter (Signed)
Nurses please send them about to 30 mg 1 daily, #30, 2 refills please send it to the pharmacy in [place] that he list in his message

## 2019-05-21 NOTE — Telephone Encounter (Signed)
Tried to call. Voicemail full.  

## 2019-05-21 NOTE — Telephone Encounter (Signed)
See my chart message

## 2019-06-23 DIAGNOSIS — F419 Anxiety disorder, unspecified: Secondary | ICD-10-CM | POA: Diagnosis not present

## 2019-06-23 DIAGNOSIS — R42 Dizziness and giddiness: Secondary | ICD-10-CM | POA: Diagnosis not present

## 2019-06-23 DIAGNOSIS — G4452 New daily persistent headache (NDPH): Secondary | ICD-10-CM | POA: Diagnosis not present

## 2019-07-23 DIAGNOSIS — G4452 New daily persistent headache (NDPH): Secondary | ICD-10-CM | POA: Diagnosis not present

## 2019-08-28 ENCOUNTER — Encounter: Payer: Self-pay | Admitting: Family Medicine

## 2019-08-28 NOTE — Telephone Encounter (Signed)
Front  Please work with the patient to schedule late appt within the coming 14 days 4:10 pm fine

## 2019-09-12 ENCOUNTER — Other Ambulatory Visit: Payer: Self-pay

## 2019-09-12 ENCOUNTER — Ambulatory Visit (INDEPENDENT_AMBULATORY_CARE_PROVIDER_SITE_OTHER): Payer: BC Managed Care – PPO | Admitting: Family Medicine

## 2019-09-12 DIAGNOSIS — K219 Gastro-esophageal reflux disease without esophagitis: Secondary | ICD-10-CM

## 2019-09-12 MED ORDER — PANTOPRAZOLE SODIUM 40 MG PO TBEC: 40.0000 mg | DELAYED_RELEASE_TABLET | Freq: Every day | ORAL | 3 refills | Status: AC

## 2019-09-12 NOTE — Progress Notes (Signed)
   Subjective:    Patient ID: BLISS TSANG, male    DOB: [birth year], 23 y.o.   MRN: [id number]  HPIfollow up on depression. Taking cymbalta 30mg . phq9 done.    Office Visit from 09/12/2019 in [facility]  PHQ-9 Total Score  1    We discussed his depression is actually doing very well he would like to taper off the Cymbalta He still has the unusual neurologic sensation of feeling swimmy in the head as well as a fullness in the ear and the fullness in his sinuses he has seen several different specialists who have not really been able to help him they have ruled out various secondary issues but nonetheless it is quite perplexing to the patient and frustrating.  Virtual Visit via Telephone Note  I connected with AXXEL GUDE on 09/12/19 at  4:10 PM EST by telephone and verified that I am speaking with the correct person using two identifiers.  Location: Patient: home Provider: office   I discussed the limitations, risks, security and privacy concerns of performing an evaluation and management service by telephone and the availability of in person appointments. I also discussed with the patient that there may be a patient responsible charge related to this service. The patient expressed understanding and agreed to proceed.   History of Present Illness:    Observations/Objective:   Assessment and Plan:   Follow Up Instructions:    I discussed the assessment and treatment plan with the patient. The patient was provided an opportunity to ask questions and all were answered. The patient agreed with the plan and demonstrated an understanding of the instructions.   The patient was advised to call back or seek an in-person evaluation if the symptoms worsen or if the condition fails to improve as anticipated.  I provided 16 minutes of non-face-to-face time during this encounter.        Review of Systems  Constitutional: Negative for activity change.   HENT: Negative for congestion and rhinorrhea.   Respiratory: Negative for cough and shortness of breath.   Cardiovascular: Negative for chest pain.  Gastrointestinal: Negative for abdominal pain, diarrhea, nausea and vomiting.  Genitourinary: Negative for dysuria and hematuria.  Neurological: Negative for weakness and headaches.  Psychiatric/Behavioral: Negative for behavioral problems and confusion.       Objective:   Physical Exam Today's visit was via telephone Physical exam was not possible for this visit        Assessment & Plan:  Quite possible patient is developing a laryngeal irritation related to reflux try pantoprazole daily basis dietary measures discussed  As for depression he states he is doing much better he would like to come off of the Cymbalta he will take it every other day for a week then stop it if he has any troubles he will let us know he denies any depression currently  Patient is living in [facility] he desires to find a physician in that area we will try to help give him some suggestions

## 2019-09-12 NOTE — Patient Instructions (Signed)

## 2019-09-14 ENCOUNTER — Encounter: Payer: Self-pay | Admitting: Family Medicine

## 2019-09-14 DIAGNOSIS — Z20828 Contact with and (suspected) exposure to other viral communicable diseases: Secondary | ICD-10-CM | POA: Diagnosis not present

## 2019-10-21 ENCOUNTER — Ambulatory Visit: Payer: BC Managed Care – PPO | Admitting: Family Medicine

## 2019-10-21 ENCOUNTER — Other Ambulatory Visit: Payer: Self-pay

## 2019-11-28 DIAGNOSIS — Z0000 Encounter for general adult medical examination without abnormal findings: Secondary | ICD-10-CM | POA: Diagnosis not present

## 2019-11-28 DIAGNOSIS — A64 Unspecified sexually transmitted disease: Secondary | ICD-10-CM | POA: Diagnosis not present

## 2019-11-28 DIAGNOSIS — Z6834 Body mass index (BMI) 34.0-34.9, adult: Secondary | ICD-10-CM | POA: Diagnosis not present

## 2019-11-28 DIAGNOSIS — R0789 Other chest pain: Secondary | ICD-10-CM | POA: Diagnosis not present

## 2019-12-02 DIAGNOSIS — Z131 Encounter for screening for diabetes mellitus: Secondary | ICD-10-CM | POA: Diagnosis not present

## 2019-12-02 DIAGNOSIS — A64 Unspecified sexually transmitted disease: Secondary | ICD-10-CM | POA: Diagnosis not present

## 2019-12-02 DIAGNOSIS — Z13220 Encounter for screening for lipoid disorders: Secondary | ICD-10-CM | POA: Diagnosis not present

## 2020-02-06 DIAGNOSIS — Z03818 Encounter for observation for suspected exposure to other biological agents ruled out: Secondary | ICD-10-CM | POA: Diagnosis not present

## 2020-02-06 DIAGNOSIS — Z20828 Contact with and (suspected) exposure to other viral communicable diseases: Secondary | ICD-10-CM | POA: Diagnosis not present

## 2020-03-02 DIAGNOSIS — Z8249 Family history of ischemic heart disease and other diseases of the circulatory system: Secondary | ICD-10-CM | POA: Diagnosis not present

## 2020-03-02 DIAGNOSIS — J320 Chronic maxillary sinusitis: Secondary | ICD-10-CM | POA: Diagnosis not present

## 2020-03-02 DIAGNOSIS — Z6834 Body mass index (BMI) 34.0-34.9, adult: Secondary | ICD-10-CM | POA: Diagnosis not present

## 2020-03-02 DIAGNOSIS — Z0000 Encounter for general adult medical examination without abnormal findings: Secondary | ICD-10-CM | POA: Diagnosis not present

## 2020-03-08 DIAGNOSIS — Z6834 Body mass index (BMI) 34.0-34.9, adult: Secondary | ICD-10-CM | POA: Diagnosis not present

## 2020-03-08 DIAGNOSIS — N451 Epididymitis: Secondary | ICD-10-CM | POA: Diagnosis not present

## 2020-03-10 DIAGNOSIS — N451 Epididymitis: Secondary | ICD-10-CM | POA: Diagnosis not present

## 2020-03-10 DIAGNOSIS — I861 Scrotal varices: Secondary | ICD-10-CM | POA: Diagnosis not present

## 2020-07-02 IMAGING — DX DG CHEST 2V
2 series · 2 of 2 positions shown · non-contrast
Comparison: None.

CLINICAL DATA: Shortness of breath

EXAM:
CHEST - 2 VIEW

[chest pa]
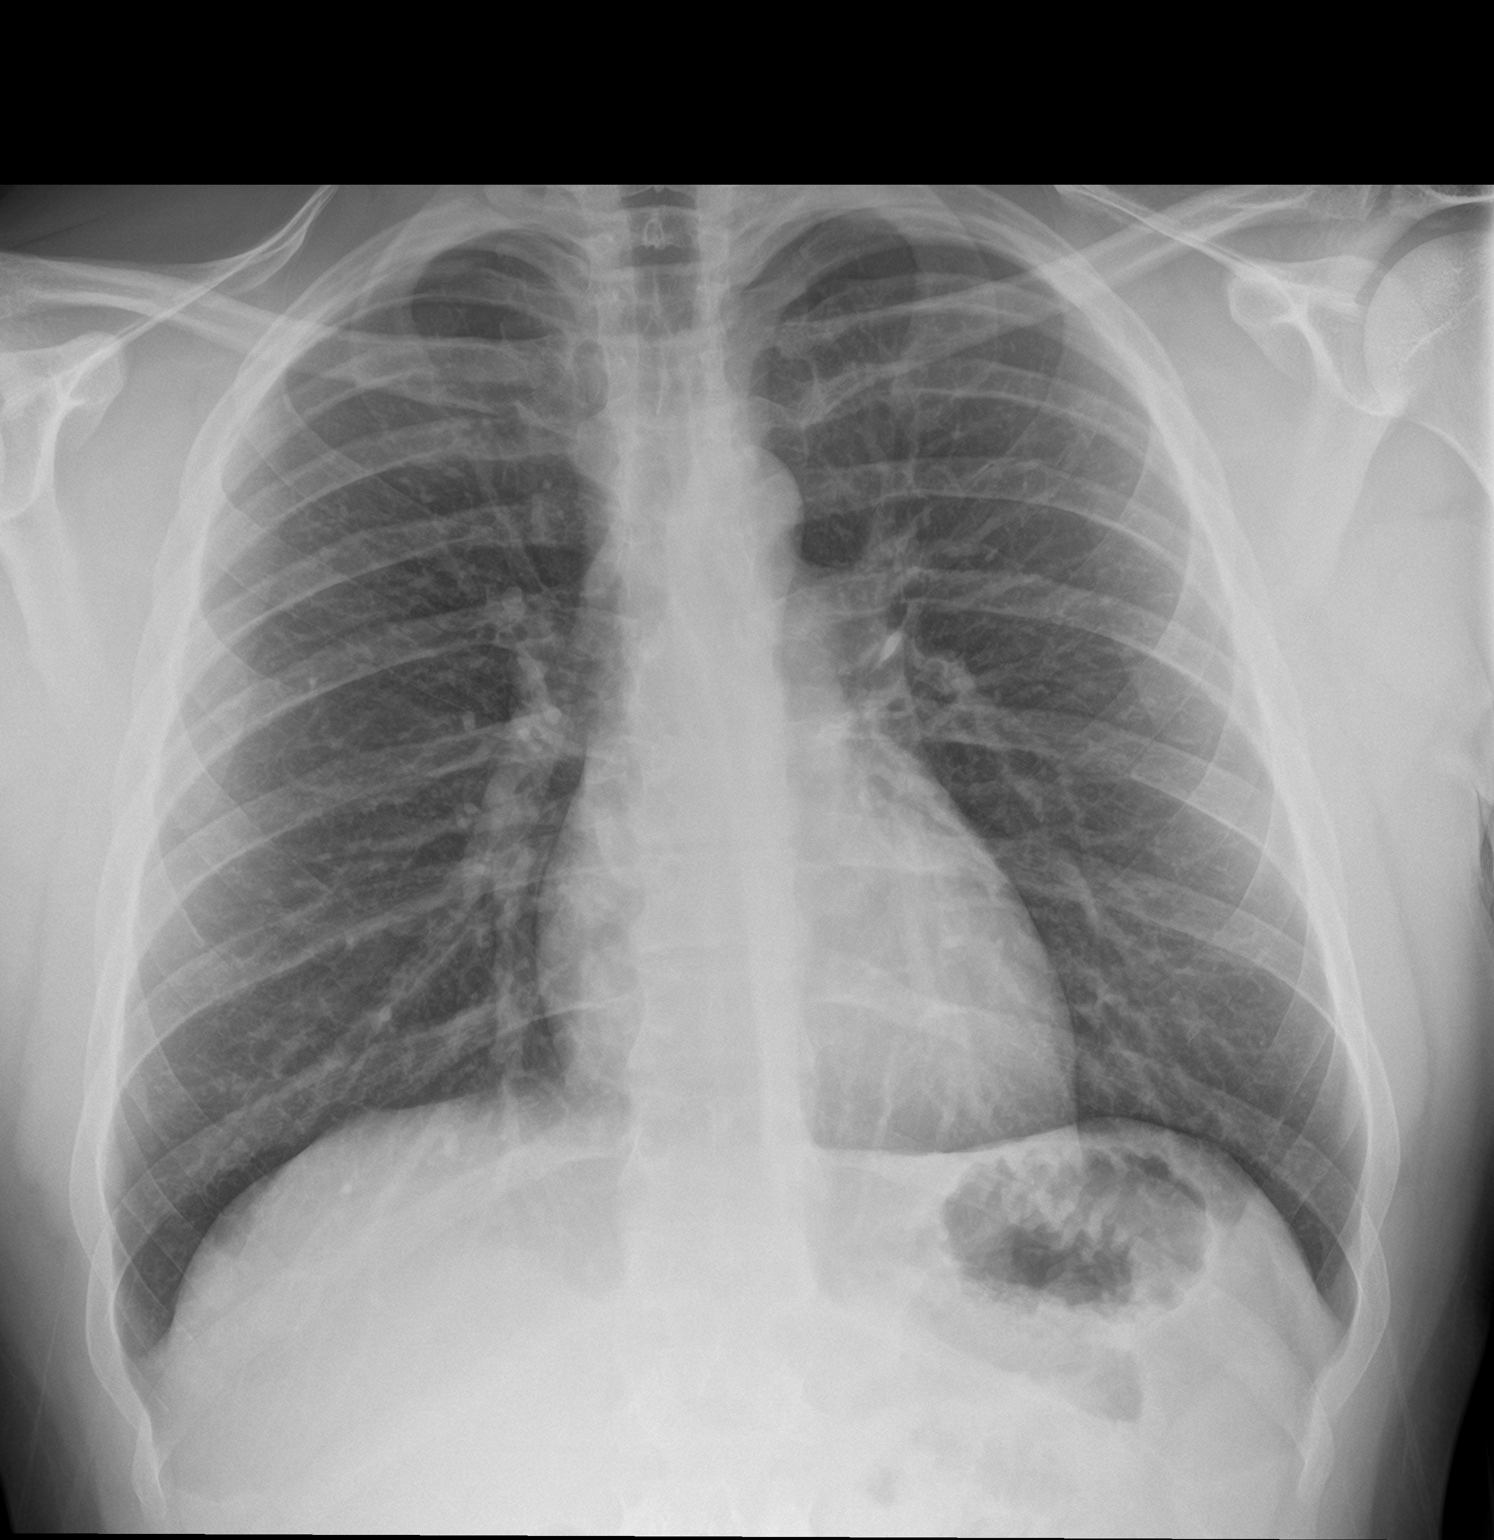

[chest lat]
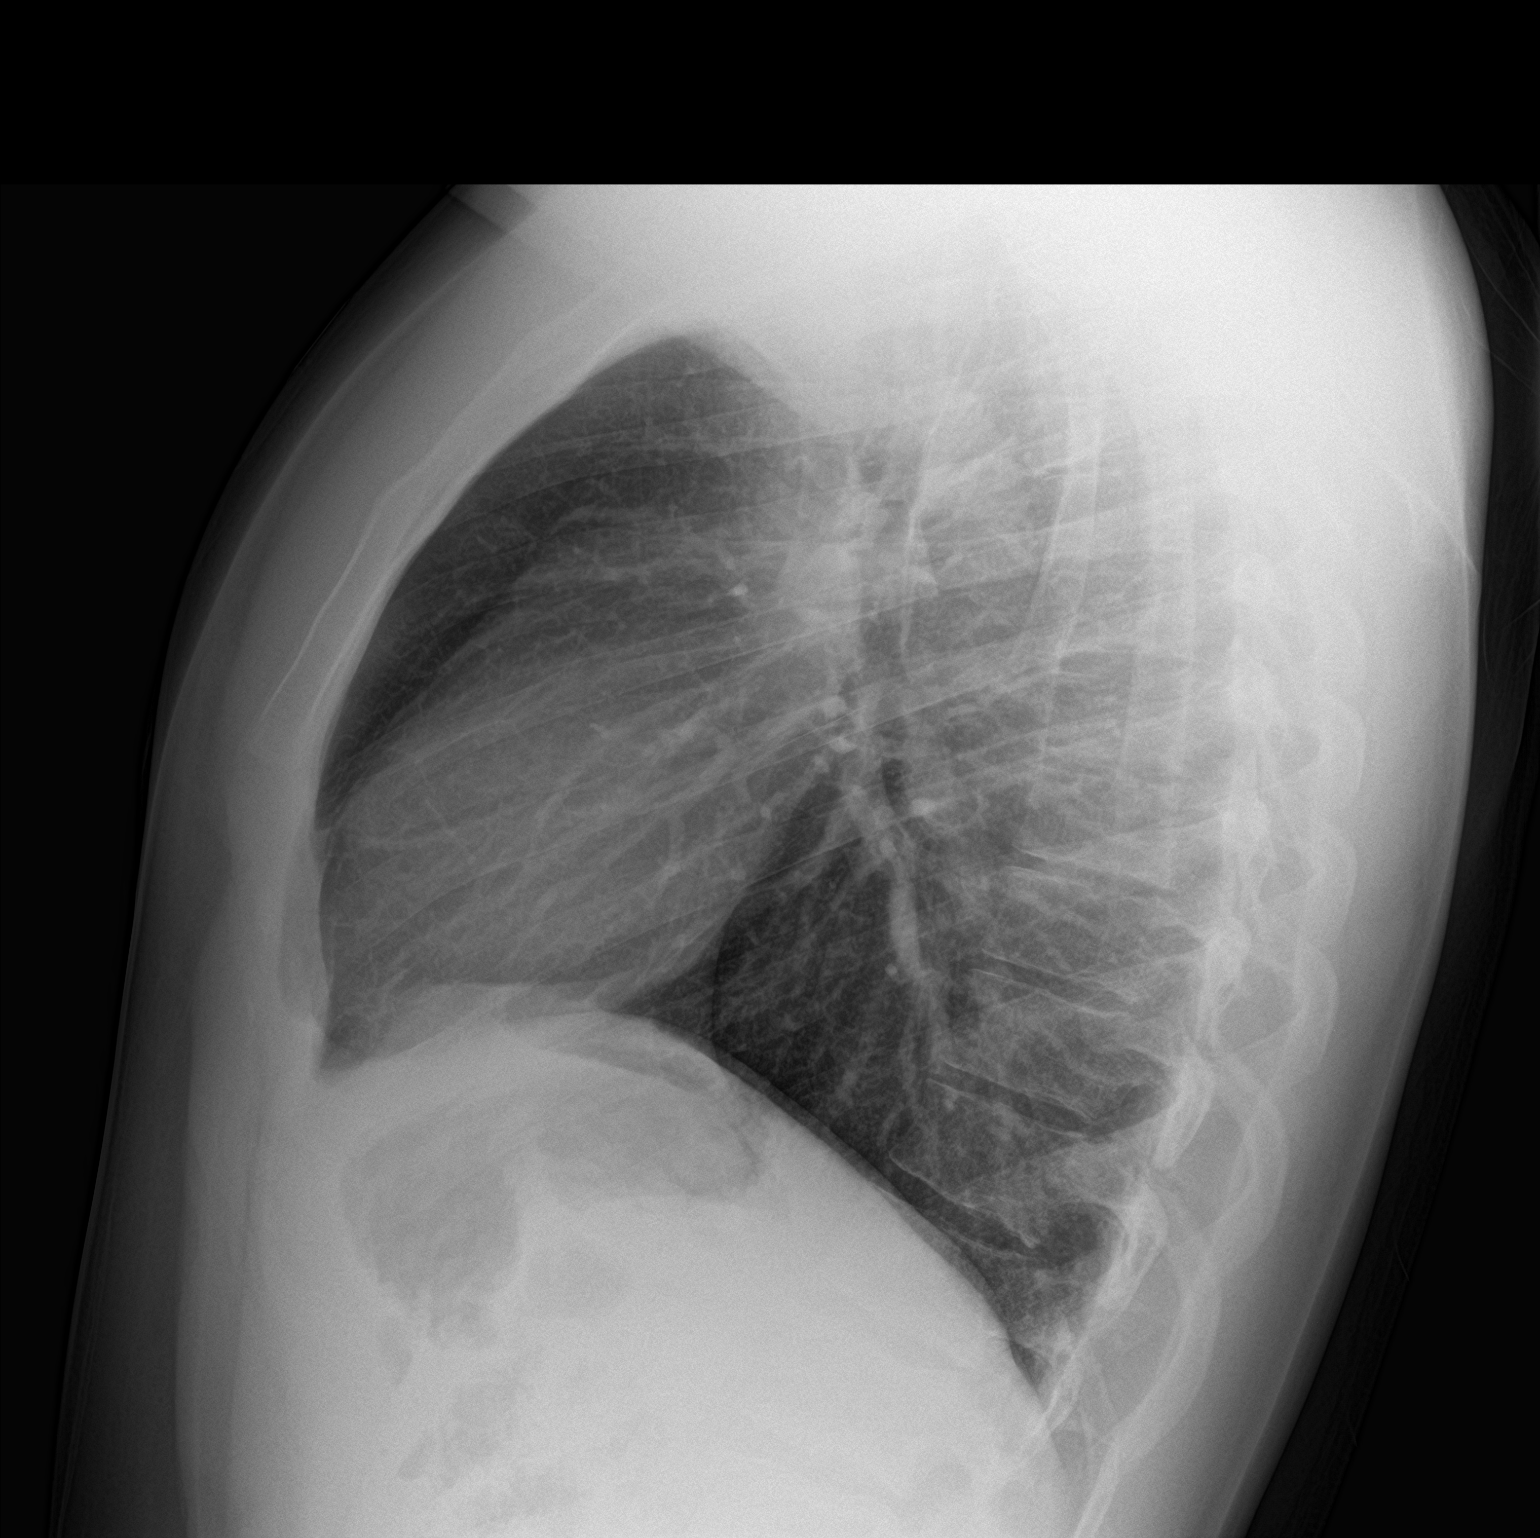

[2 of 2 positions shown; findings below may reference images not displayed]

FINDINGS: The heart size and mediastinal contours are within normal limits.
Both lungs are clear. The visualized skeletal structures are
unremarkable.
IMPRESSION: No active cardiopulmonary disease.

## 2020-07-10 IMAGING — CT CT ANGIO CHEST
2 of 6 series · 19 of 46 positions shown · IV contrast (Isovue)
Comparison: None.

CLINICAL DATA: Chest pain and shortness of breath.

EXAM:
CT ANGIOGRAPHY CHEST WITH CONTRAST
TECHNIQUE: Multidetector CT imaging of the chest was performed using the
standard protocol during bolus administration of intravenous
contrast. Multiplanar CT image reconstructions and MIPs were
obtained to evaluate the vascular anatomy.
CONTRAST:  100mL [id number]-UPG IOPAMIDOL (9TEC4J-UPG) INJECTION 76%

[Series 6: thins · axial · 0.59mm/px · z∈[-72,+201]mm · 16 of 301 slices shown]
[im 14/301  lung]
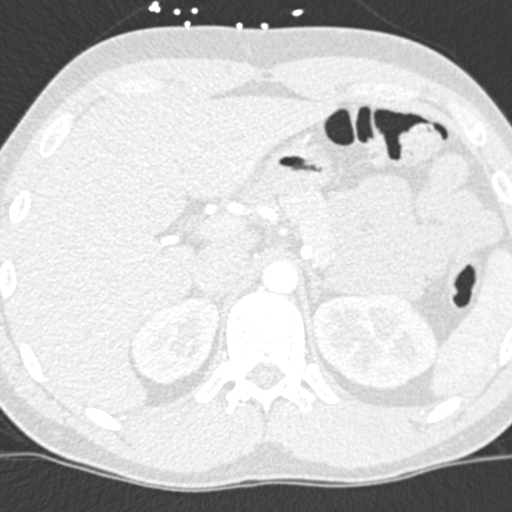
[im 40/301  soft-tissue]
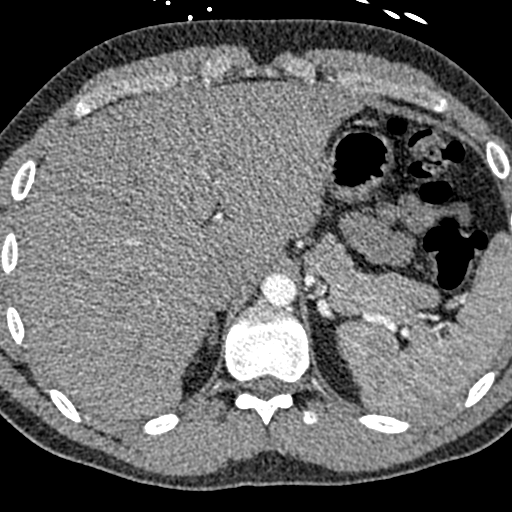
[im 53/301  lung]
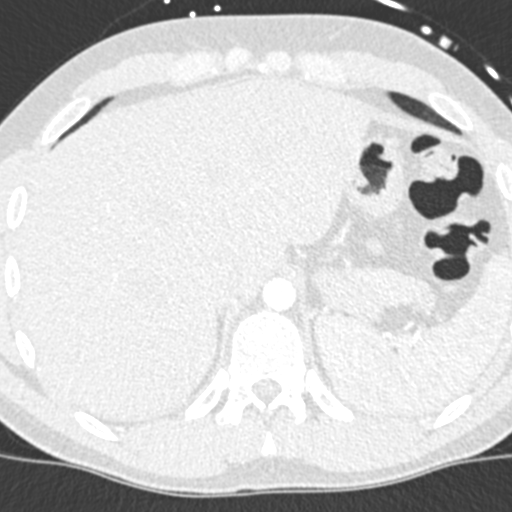
[im 66/301  soft-tissue]
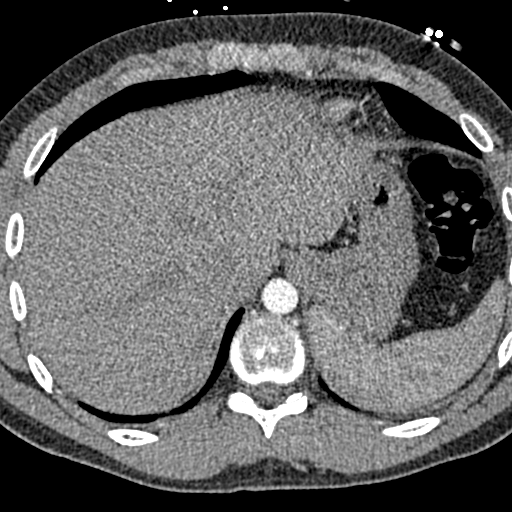
[im 92/301  lung]
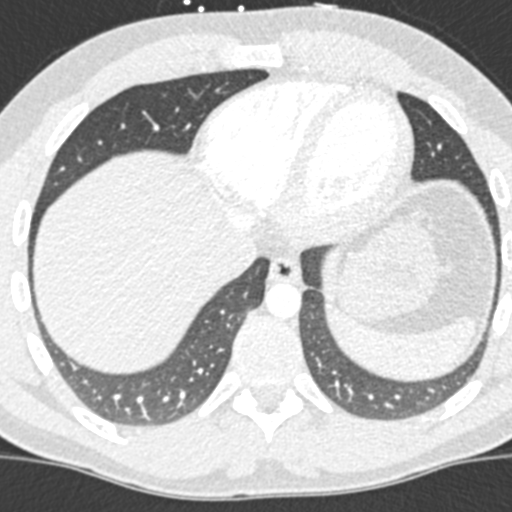
[im 105/301  soft-tissue]
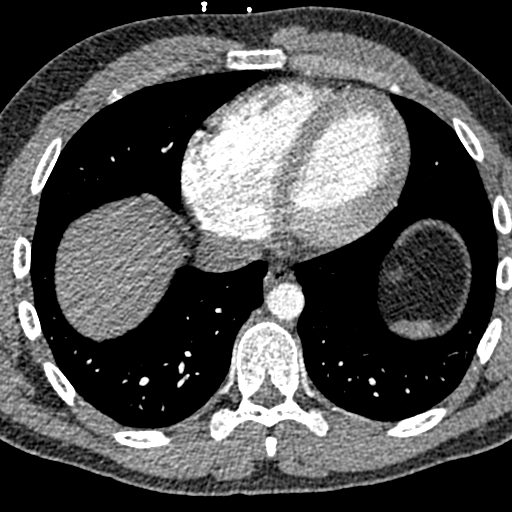
[im 118/301  lung]
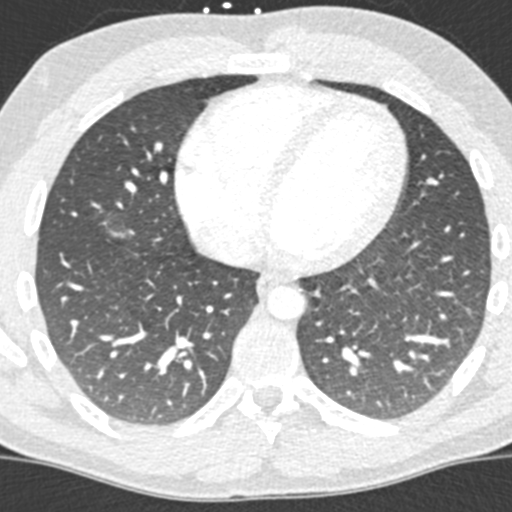
[im 144/301  soft-tissue]
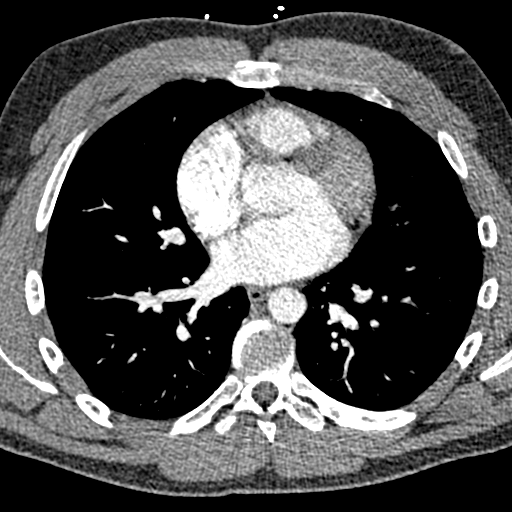
[im 157/301  lung]
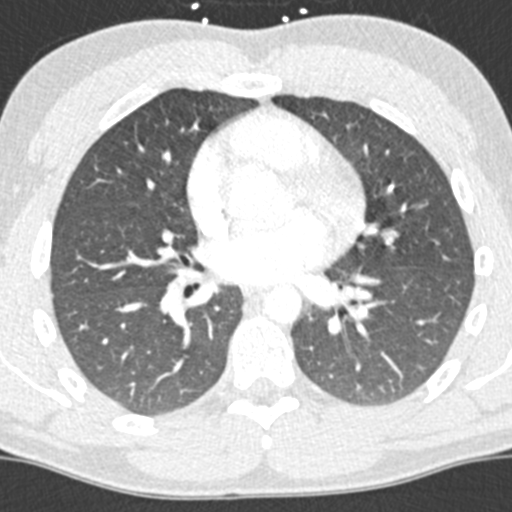
[im 183/301  soft-tissue]
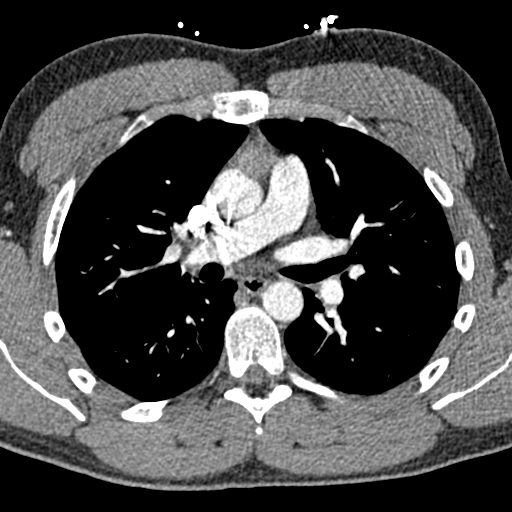
[im 196/301  lung]
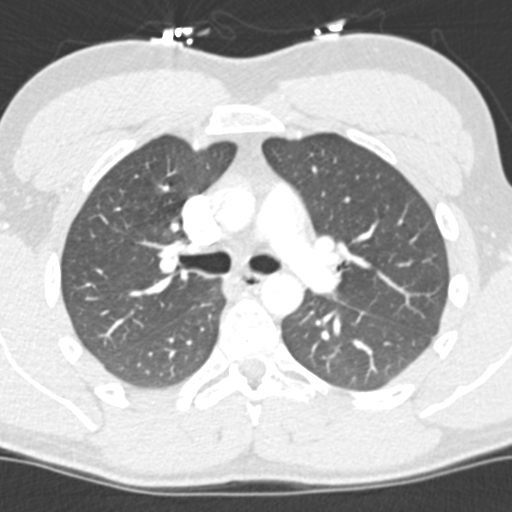
[im 209/301  soft-tissue]
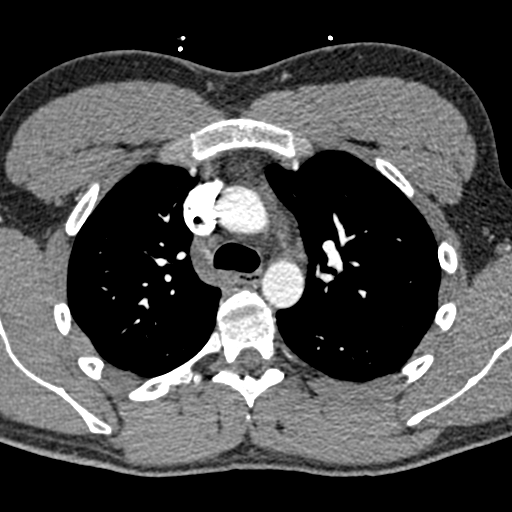
[im 235/301  lung]
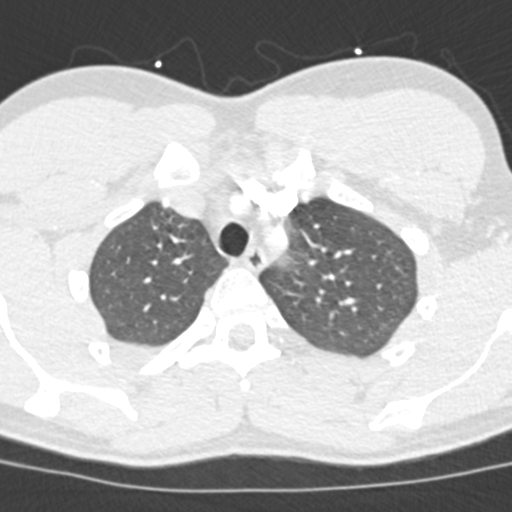
[im 248/301  soft-tissue]
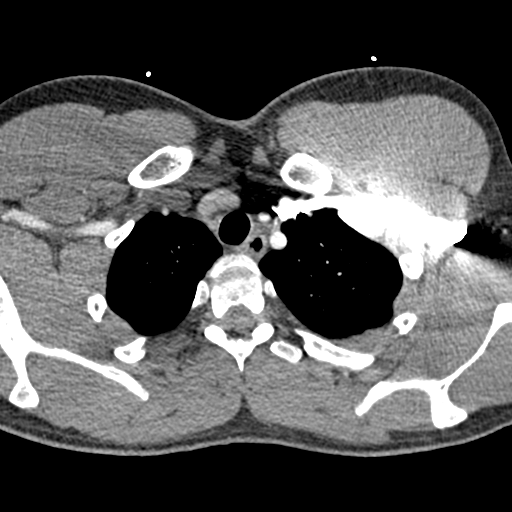
[im 261/301  lung]
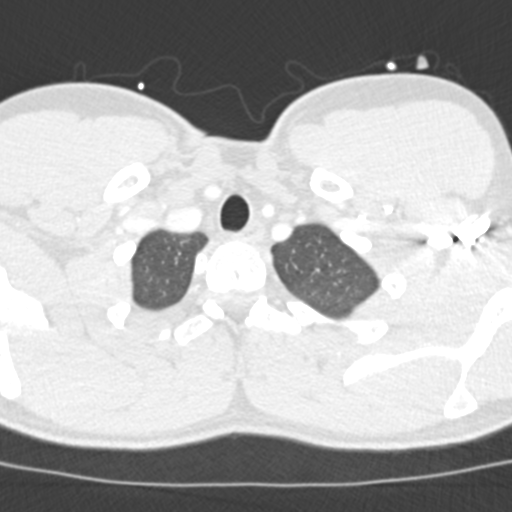
[im 287/301  soft-tissue]
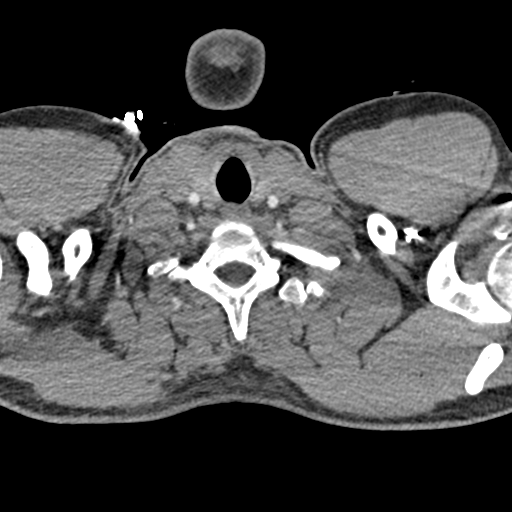

[Series 8: coronal mpr · coronal · 0.63mm/px · 3 of 151 slices shown]
[im 38/151  soft-tissue]
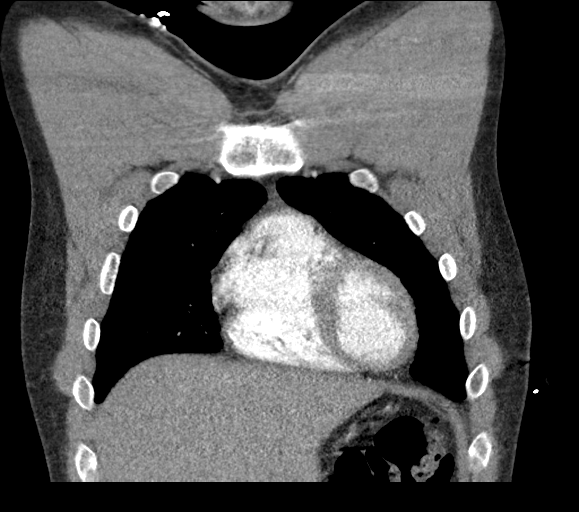
[im 76/151  soft-tissue]
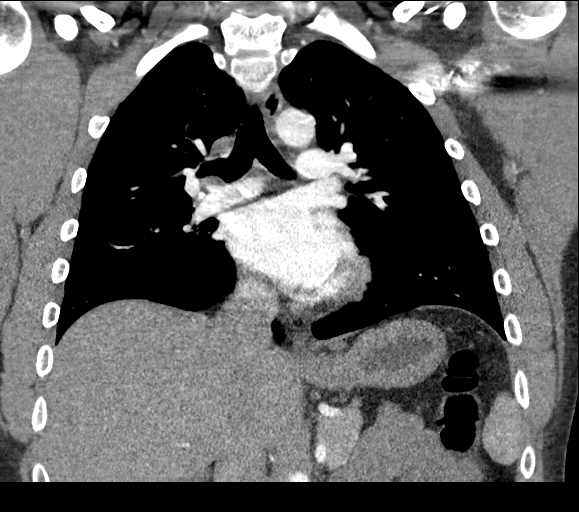
[im 113/151  soft-tissue]
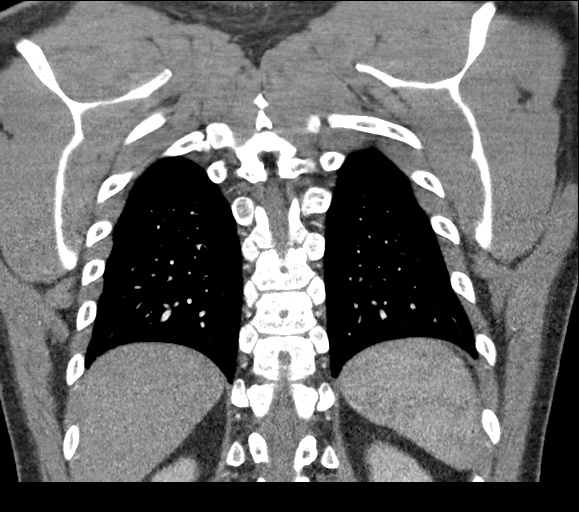

[19 of 46 positions shown; findings below may reference images not displayed]

FINDINGS: Cardiovascular: The heart size is normal. No substantial pericardial
effusion. No thoracic aortic aneurysm. No filling defects in the
opacified pulmonary arteries to suggest acute pulmonary embolus.

Mediastinum/Nodes: No mediastinal lymphadenopathy. There is no hilar
lymphadenopathy. The esophagus has normal imaging features. There is
no axillary lymphadenopathy.

Lungs/Pleura: Lungs are clear without focal airspace consolidation
or pulmonary edema. No pleural effusion. No pneumothorax.

Upper Abdomen: Unremarkable.

Musculoskeletal: No worrisome lytic or sclerotic osseous
abnormality.

Review of the MIP images confirms the above findings.
IMPRESSION: Unremarkable study.  No CT evidence for acute pulmonary embolus.

## 2021-02-28 DIAGNOSIS — Z131 Encounter for screening for diabetes mellitus: Secondary | ICD-10-CM | POA: Diagnosis not present

## 2021-02-28 DIAGNOSIS — Z13220 Encounter for screening for lipoid disorders: Secondary | ICD-10-CM | POA: Diagnosis not present

## 2021-02-28 DIAGNOSIS — Z0000 Encounter for general adult medical examination without abnormal findings: Secondary | ICD-10-CM | POA: Diagnosis not present

## 2021-03-04 DIAGNOSIS — E7841 Elevated Lipoprotein(a): Secondary | ICD-10-CM | POA: Diagnosis not present

## 2021-03-04 DIAGNOSIS — Z0000 Encounter for general adult medical examination without abnormal findings: Secondary | ICD-10-CM | POA: Diagnosis not present

## 2021-03-04 DIAGNOSIS — N451 Epididymitis: Secondary | ICD-10-CM | POA: Diagnosis not present

## 2021-03-04 DIAGNOSIS — Z6834 Body mass index (BMI) 34.0-34.9, adult: Secondary | ICD-10-CM | POA: Diagnosis not present
